# Patient Record
Sex: Female | Born: 1980 | Race: White | Hispanic: No | Marital: Single | Smoking: Never smoker
Health system: Southern US, Community
[De-identification: ages and names within clinical notes are randomized; demographics above are authoritative.]

## PROBLEM LIST (undated history)

## (undated) DIAGNOSIS — C44619 Basal cell carcinoma of skin of left upper limb, including shoulder: Secondary | ICD-10-CM

## (undated) DIAGNOSIS — R112 Nausea with vomiting, unspecified: Secondary | ICD-10-CM

## (undated) DIAGNOSIS — T8859XA Other complications of anesthesia, initial encounter: Secondary | ICD-10-CM

## (undated) DIAGNOSIS — E785 Hyperlipidemia, unspecified: Secondary | ICD-10-CM

## (undated) DIAGNOSIS — T4145XA Adverse effect of unspecified anesthetic, initial encounter: Secondary | ICD-10-CM

## (undated) DIAGNOSIS — Z9889 Other specified postprocedural states: Secondary | ICD-10-CM

## (undated) DIAGNOSIS — D649 Anemia, unspecified: Secondary | ICD-10-CM

## (undated) HISTORY — DX: Hyperlipidemia, unspecified: E78.5

## (undated) HISTORY — PX: WISDOM TOOTH EXTRACTION: SHX21

## (undated) HISTORY — DX: Basal cell carcinoma of skin of left upper limb, including shoulder: C44.619

---
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
## 2000-09-18 ENCOUNTER — Other Ambulatory Visit: Admission: RE | Admit: 2000-09-18 | Discharge: 2000-09-18 | Payer: Self-pay | Admitting: *Deleted

## 2001-10-05 ENCOUNTER — Other Ambulatory Visit: Admission: RE | Admit: 2001-10-05 | Discharge: 2001-10-05 | Payer: Self-pay | Admitting: Obstetrics and Gynecology

## 2002-10-12 ENCOUNTER — Other Ambulatory Visit: Admission: RE | Admit: 2002-10-12 | Discharge: 2002-10-12 | Payer: Self-pay | Admitting: Obstetrics and Gynecology

## 2003-11-01 ENCOUNTER — Other Ambulatory Visit: Admission: RE | Admit: 2003-11-01 | Discharge: 2003-11-01 | Payer: Self-pay | Admitting: Obstetrics and Gynecology

## 2004-01-23 ENCOUNTER — Encounter: Admission: RE | Admit: 2004-01-23 | Discharge: 2004-01-23 | Payer: Self-pay | Admitting: Family Medicine

## 2005-01-21 ENCOUNTER — Other Ambulatory Visit: Admission: RE | Admit: 2005-01-21 | Discharge: 2005-01-21 | Payer: Self-pay | Admitting: Obstetrics and Gynecology

## 2005-03-17 HISTORY — PX: BASAL CELL CARCINOMA EXCISION: SHX1214

## 2005-05-15 DIAGNOSIS — C44619 Basal cell carcinoma of skin of left upper limb, including shoulder: Secondary | ICD-10-CM

## 2005-05-15 HISTORY — DX: Basal cell carcinoma of skin of left upper limb, including shoulder: C44.619

## 2006-04-10 ENCOUNTER — Other Ambulatory Visit: Admission: RE | Admit: 2006-04-10 | Discharge: 2006-04-10 | Payer: Self-pay | Admitting: Obstetrics & Gynecology

## 2006-06-18 IMAGING — CR DG CHEST 2V
2 series · 2 of 2 positions shown · non-contrast
Comparison: none

CLINICAL DATA: Cough. 
 CHEST X-RAY: 
 The heart size and mediastinal contours are unremarkable.  The lungs are clear.  The visualized skeleton is unremarkable.

[view not recorded (1 of 2)]
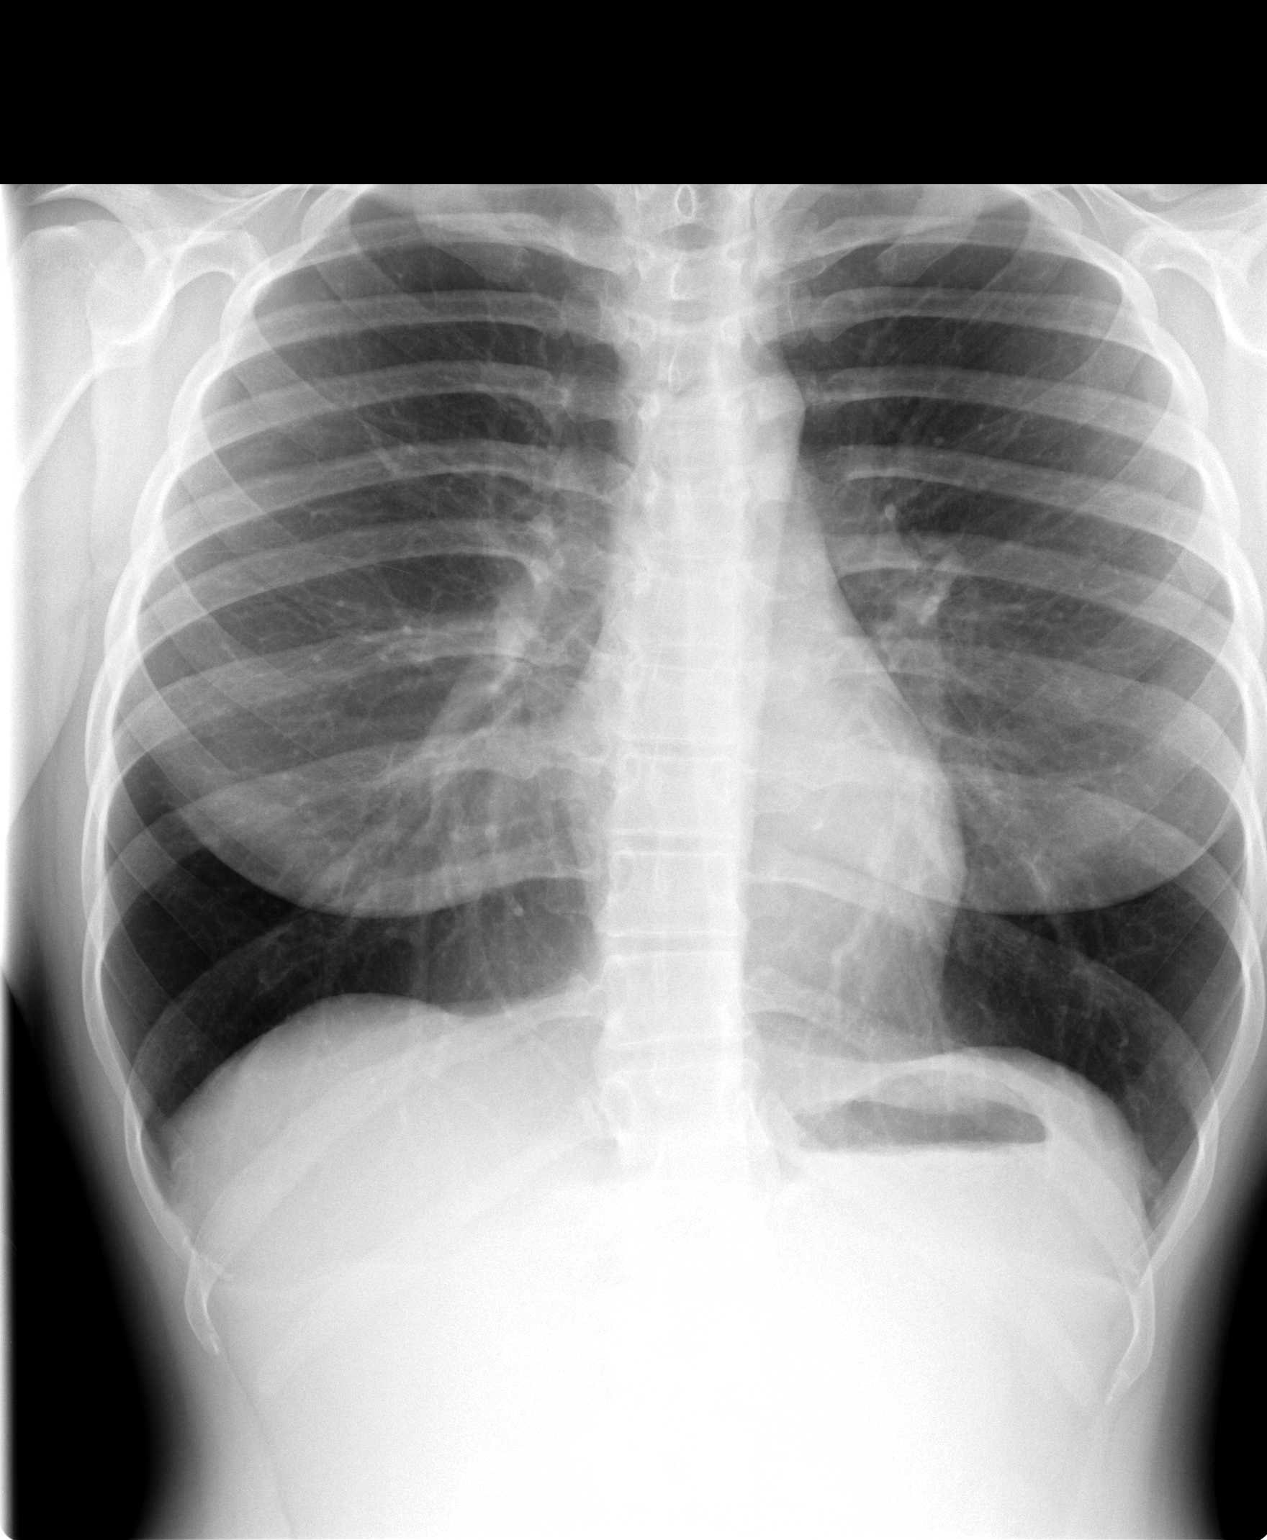

[view not recorded (2 of 2)]
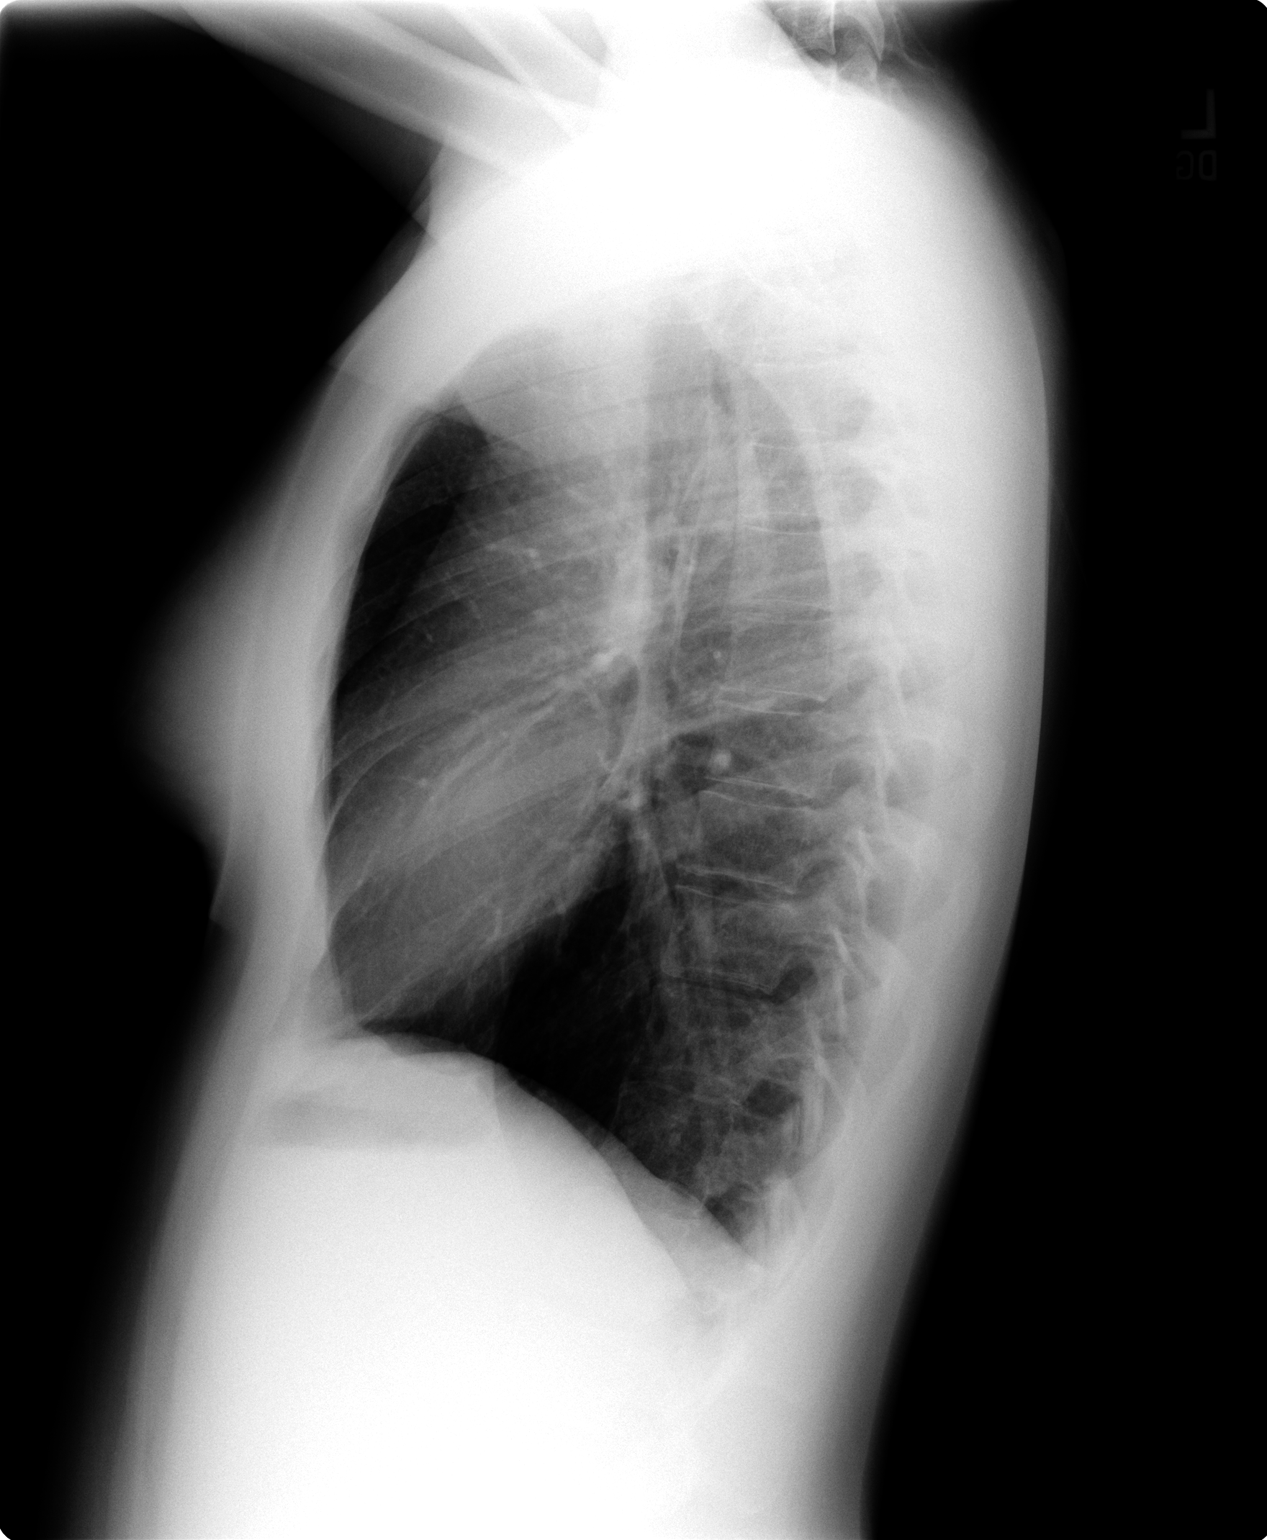

[2 of 2 positions shown; findings below may reference images not displayed]

IMPRESSION: No active lung disease.

## 2007-04-12 ENCOUNTER — Other Ambulatory Visit: Admission: RE | Admit: 2007-04-12 | Discharge: 2007-04-12 | Payer: Self-pay | Admitting: Obstetrics and Gynecology

## 2008-04-17 ENCOUNTER — Other Ambulatory Visit: Admission: RE | Admit: 2008-04-17 | Discharge: 2008-04-17 | Payer: Self-pay | Admitting: Obstetrics and Gynecology

## 2012-05-11 LAB — HM PAP SMEAR

## 2013-03-07 ENCOUNTER — Encounter: Payer: Self-pay | Admitting: Nurse Practitioner

## 2013-04-13 ENCOUNTER — Other Ambulatory Visit: Payer: Self-pay | Admitting: Nurse Practitioner

## 2013-04-13 NOTE — Telephone Encounter (Signed)
Last Refilled: 05/11/12 #90/3 refills AEX 05/17/13 with PG #84/0 refills sent to pharmacy to last her until AEX

## 2013-05-13 ENCOUNTER — Ambulatory Visit: Payer: Self-pay | Admitting: Nurse Practitioner

## 2013-05-17 ENCOUNTER — Encounter: Payer: Self-pay | Admitting: Nurse Practitioner

## 2013-05-17 ENCOUNTER — Ambulatory Visit (INDEPENDENT_AMBULATORY_CARE_PROVIDER_SITE_OTHER): Payer: BC Managed Care – PPO | Admitting: Nurse Practitioner

## 2013-05-17 VITALS — BP 104/66 | HR 80 | Ht 62.5 in | Wt 153.0 lb

## 2013-05-17 DIAGNOSIS — Z0000 Encounter for general adult medical examination without abnormal findings: Secondary | ICD-10-CM

## 2013-05-17 DIAGNOSIS — Z01419 Encounter for gynecological examination (general) (routine) without abnormal findings: Secondary | ICD-10-CM

## 2013-05-17 LAB — HEMOGLOBIN, FINGERSTICK: Hemoglobin, fingerstick: 12.5 g/dL (ref 12.0–16.0)

## 2013-05-17 MED ORDER — NORETHINDRONE-ETH ESTRADIOL 0.5-35 MG-MCG PO TABS: 1.0000 | ORAL_TABLET | Freq: Every day | ORAL | Status: DC

## 2013-05-17 NOTE — Patient Instructions (Signed)

## 2013-05-17 NOTE — Progress Notes (Signed)
Patient ID: Zollie Scale, female   DOB: August 28, 1980, 33 y.o.   MRN: [id number] 33 y.o. G0P0 Single Caucasian Fe here for annual exam.  Menses  At 4-5 days and heavy.  But still BTB a week prior to menses with spotting and occasional at mid cycle. Compliant to OCP.  In the past did well on Necon 1/35.  Not SA for some time.  Patient's last menstrual period was 05/12/2013.          Sexually active: no  The current method of family planning is OCP (estrogen/progesterone) and abstinence.    Exercising: yes  Gym/ health club routine includes cardio and light to moderate weights 3-5 times per week. Smoker:  no  Health Maintenance: Pap:  05/11/12, WNL TDaP:  04/19/09 Labs:  HB: 12.5   reports that she has never smoked. She has never used smokeless tobacco. She reports that she drinks about 1.5 ounces of alcohol per week. She reports that she does not use illicit drugs.  Past Medical History  Diagnosis Date  . Basal cell carcinoma of left shoulder 05/2005    Past Surgical History  Procedure Laterality Date  . Basal cell carcinoma excision  2007    left shoulder  . Wisdom tooth extraction  age 16    Current Outpatient Prescriptions  Medication Sig Dispense Refill  . norethindrone-ethinyl estradiol (NECON,BREVICON,MODICON) 0.5-35 MG-MCG tablet Take 1 tablet by mouth daily.  3 Package  3   No current facility-administered medications for this visit.    Family History  Problem Relation Age of Onset  . Multiple births Other     had twins  . Hypertension Maternal Grandmother   . Kidney Stones Maternal Grandfather   . Anemia Mother   . Thyroid disease Paternal Grandmother     ROS:  Pertinent items are noted in HPI.  Otherwise, a comprehensive ROS was negative.  Exam:   BP 104/66  Pulse 80  Ht 5' 2.5" (1.588 m)  Wt 153 lb (69.4 kg)  BMI 27.52 kg/m2  LMP 05/12/2013 Height: 5' 2.5" (158.8 cm)  Ht Readings from Last 3 Encounters:  05/17/13 5' 2.5" (1.588 m)    General appearance:  alert, cooperative and appears stated age Head: Normocephalic, without obvious abnormality, atraumatic Neck: no adenopathy, supple, symmetrical, trachea midline and thyroid normal to inspection and palpation Lungs: clear to auscultation bilaterally Breasts: normal appearance, no masses or tenderness Heart: regular rate and rhythm Abdomen: soft, non-tender; no masses,  no organomegaly Extremities: extremities normal, atraumatic, no cyanosis or edema Skin: Skin color, texture, turgor normal. No rashes or lesions Lymph nodes: Cervical, supraclavicular, and axillary nodes normal. No abnormal inguinal nodes palpated Neurologic: Grossly normal   Pelvic: External genitalia:  no lesions              Urethra:  normal appearing urethra with no masses, tenderness or lesions              Bartholin's and Skene's: normal                 Vagina: normal appearing vagina with normal color and discharge, no lesions              Cervix: anteverted              Pap taken: no Bimanual Exam:  Uterus:  normal size, contour, position, consistency, mobility, non-tender              Adnexa: no mass, fullness, tenderness  Rectovaginal: Confirms               Anus:  normal sphincter tone, no lesions  A:  Well Woman with normal exam  OCP for cycle regulation  P:   Pap smear as per guidelines   Will repeat pap and HPV next year  Change OCP to Necon 1/35 for a year - let us know if still heavy and BTB issues.  Counseled on breast self exam, adequate intake of calcium and vitamin D, diet and exercise return annually or prn  An After Visit Summary was printed and given to the patient.

## 2013-05-18 NOTE — Progress Notes (Signed)
Encounter reviewed by Dr. Brook Silva.  

## 2014-05-19 ENCOUNTER — Ambulatory Visit (INDEPENDENT_AMBULATORY_CARE_PROVIDER_SITE_OTHER): Payer: BC Managed Care – PPO | Admitting: Nurse Practitioner

## 2014-05-19 ENCOUNTER — Encounter: Payer: Self-pay | Admitting: Nurse Practitioner

## 2014-05-19 VITALS — BP 114/72 | HR 72 | Ht 62.5 in | Wt 169.0 lb

## 2014-05-19 DIAGNOSIS — Z113 Encounter for screening for infections with a predominantly sexual mode of transmission: Secondary | ICD-10-CM | POA: Diagnosis not present

## 2014-05-19 DIAGNOSIS — R319 Hematuria, unspecified: Secondary | ICD-10-CM

## 2014-05-19 DIAGNOSIS — Z0000 Encounter for general adult medical examination without abnormal findings: Secondary | ICD-10-CM

## 2014-05-19 DIAGNOSIS — Z01419 Encounter for gynecological examination (general) (routine) without abnormal findings: Secondary | ICD-10-CM

## 2014-05-19 LAB — POCT URINALYSIS DIPSTICK
Bilirubin, UA: NEGATIVE
Blood, UA: NEGATIVE
Glucose, UA: NEGATIVE
Ketones, UA: NEGATIVE
Leukocytes, UA: NEGATIVE
NITRITE UA: NEGATIVE
Protein, UA: NEGATIVE
Urobilinogen, UA: NEGATIVE
pH, UA: 6

## 2014-05-19 LAB — WET PREP BY MOLECULAR PROBE
CANDIDA SPECIES: NEGATIVE
Gardnerella vaginalis: NEGATIVE
Trichomonas vaginosis: NEGATIVE

## 2014-05-19 LAB — HEMOGLOBIN, FINGERSTICK: Hemoglobin, fingerstick: 12.4 g/dL (ref 12.0–16.0)

## 2014-05-19 MED ORDER — NECON 1/35 (28) 1-35 MG-MCG PO TABS: 1.0000 | ORAL_TABLET | Freq: Every day | ORAL | Status: DC

## 2014-05-19 NOTE — Patient Instructions (Signed)

## 2014-05-19 NOTE — Progress Notes (Signed)
Patient ID: Mariah Nichols, female   DOB: [birth year], 34 y.o.   MRN: [id number] 34 y.o. G0P0 Single  Caucasian Fe here for annual exam.  Menses normally 5-7 days. At mid cycle with spotting for 3-5 days since on the generic Necon - dose is changed by pharmacy to 0.5/35 instead of 1/35.  Will refill her original OCP for brand only.  If still mid cycle BTB to call back. The long term on/off relationship is now ended.  She has found out he has been unfaithful.  Desires to get STD's.  Patient's last menstrual period was 05/05/2014.          Sexually active: Yes.    The current method of family planning is OCP (estrogen/progesterone).    Exercising: No.  Pt has not been exercising routinely recently Smoker:  no  Health Maintenance: Pap:  05/10/12, negative  TDaP:  04/19/09 Labs: HB:  12.4 Urine:  Small RBC   reports that she has never smoked. She has never used smokeless tobacco. She reports that she drinks about 1.5 oz of alcohol per week. She reports that she does not use illicit drugs.  Past Medical History  Diagnosis Date  . Basal cell carcinoma of left shoulder 05/2005    Past Surgical History  Procedure Laterality Date  . Basal cell carcinoma excision  2007    left shoulder  . Wisdom tooth extraction  age 86    Current Outpatient Prescriptions  Medication Sig Dispense Refill  . NECON 1/35, 28, tablet Take 1 tablet by mouth daily. 3 Package 3   No current facility-administered medications for this visit.    Family History  Problem Relation Age of Onset  . Multiple births Other     had twins  . Hypertension Maternal Grandmother   . Kidney Stones Maternal Grandfather   . Anemia Mother   . Uterine cancer Mother 69    uterus  . Thyroid disease Paternal Grandmother     ROS:  Pertinent items are noted in HPI.  Otherwise, a comprehensive ROS was negative.  Exam:   BP 114/72 mmHg  Pulse 72  Ht 5' 2.5" (1.588 m)  Wt 169 lb (76.658 kg)  BMI 30.40 kg/m2  LMP 05/05/2014 Height:  5' 2.5" (158.8 cm) Ht Readings from Last 3 Encounters:  05/19/14 5' 2.5" (1.588 m)  05/17/13 5' 2.5" (1.588 m)    General appearance: alert, cooperative and appears stated age Head: Normocephalic, without obvious abnormality, atraumatic Neck: no adenopathy, supple, symmetrical, trachea midline and thyroid normal to inspection and palpation Lungs: clear to auscultation bilaterally Breasts: normal appearance, no masses or tenderness Heart: regular rate and rhythm Abdomen: soft, non-tender; no masses,  no organomegaly Extremities: extremities normal, atraumatic, no cyanosis or edema Skin: Skin color, texture, turgor normal. No rashes or lesions Lymph nodes: Cervical, supraclavicular, and axillary nodes normal. No abnormal inguinal nodes palpated Neurologic: Grossly normal   Pelvic: External genitalia:  no lesions              Urethra:  normal appearing urethra with no masses, tenderness or lesions              Bartholin's and Skene's: normal                 Vagina: normal appearing vagina with normal color and discharge, no lesions              Cervix: anteverted  Pap taken: Yes.   Bimanual Exam:  Uterus:  normal size, contour, position, consistency, mobility, non-tender              Adnexa: no mass, fullness, tenderness               Rectovaginal: Confirms               Anus:  normal sphincter tone, no lesions  Chaperone present: yes  A:  Well Woman with normal exam  Contraception  R/O STD's  Hematuria asymptomatic  P:   Reviewed health and wellness pertinent to exam  Pap smear taken today  Change Rx for Necon to be 1/35 mcg. for a year  Follow with STD's, urine culture  Counseled on breast self exam, use and side effects of OCP's, adequate intake of calcium and vitamin D, diet and exercise return annually or prn  An After Visit Summary was printed and given to the patient.

## 2014-05-20 LAB — URINALYSIS, MICROSCOPIC ONLY
Bacteria, UA: NONE SEEN
Casts: NONE SEEN
Crystals: NONE SEEN
Squamous Epithelial / HPF: NONE SEEN

## 2014-05-20 LAB — STD PANEL
HIV: NONREACTIVE
Hepatitis B Surface Ag: NEGATIVE

## 2014-05-21 LAB — URINE CULTURE
Colony Count: NO GROWTH
Organism ID, Bacteria: NO GROWTH

## 2014-05-22 NOTE — Progress Notes (Signed)
Encounter reviewed by Dr. Brook Silva.  

## 2014-05-23 ENCOUNTER — Telehealth: Payer: Self-pay | Admitting: *Deleted

## 2014-05-23 NOTE — Telephone Encounter (Signed)
I have attempted to contact this patient by phone with the following results: left message to return call to [place] at [phone] on answering machine (mobile per [facility]).  [phone] (Mobile)  Pt name verified in voicemail.  Advised call was regarding lab results and to return call at her convenience.

## 2014-05-23 NOTE — Telephone Encounter (Signed)
-----   Message from Milford Cage, [place] sent at 05/21/2014  6:36 PM EST ----- Let pt. know that urine culture, Affirm and STD panel with HIV, Hep B. STS. is normal.  The GC and Chl is pending

## 2014-05-24 LAB — IPS N GONORRHOEA AND CHLAMYDIA BY PCR

## 2014-05-24 LAB — IPS PAP TEST WITH HPV

## 2014-05-24 NOTE — Telephone Encounter (Signed)
Pt notified in result note.  GC/Chlamydia still pending.  Leaving note open.

## 2014-07-03 ENCOUNTER — Other Ambulatory Visit: Payer: Self-pay | Admitting: Nurse Practitioner

## 2014-07-03 NOTE — Telephone Encounter (Signed)
Medication refill request: Nortrel. Patient is on Necon  Last AEX:  05/19/14 PG Next AEX: 05/24/15 PG Last MMG (if hormonal medication request): none Refill authorized: 05/19/14 #3packs/ 3 refills to [facility]

## 2014-07-04 ENCOUNTER — Telehealth: Payer: Self-pay | Admitting: Nurse Practitioner

## 2014-07-04 NOTE — Telephone Encounter (Signed)
LMTCB about canceled appointment °

## 2014-07-05 NOTE — Telephone Encounter (Signed)
Pt notified of all results in result note on 05/26/14.  Closing encounter.

## 2014-10-16 ENCOUNTER — Telehealth: Payer: Self-pay | Admitting: Nurse Practitioner

## 2014-10-16 NOTE — Telephone Encounter (Signed)
Spoke with patient. Patient states that she has been taking Necon for a couple of years but has had to increase the dosage due to mid cycle bleeding. States dosage was increased again at aex on 05/19/2014 with Milford Cage, FNP and everything has been going well until this month. Patient is now having intermittent break through bleeding like her cycle that will last a day. Next menses due to start next week. Had bleeding once today and once last week. States bleeding is accompanied with cramping. Patient is not sexually active. States Milford Cage, FNP mentioned her having a PUS performed. Requesting recommendations on what to do at this point. Advised I will speak with Milford Cage, FNP and return call with further recommendations. Patient is agreeable.

## 2014-10-16 NOTE — Telephone Encounter (Signed)
Patient is having irregular menses.

## 2014-10-17 NOTE — Telephone Encounter (Signed)
I discussed her care with Dr. Quincy Simmonds last pm - she recommends an OV first then decide if PUS is needed.  Can you make a follow up apt. With Dr. Quincy Simmonds or Dr. Talbert Nan.

## 2014-10-17 NOTE — Telephone Encounter (Signed)
Spoke with patient. Advised of message as seen below from Mariah Nichols, [place]. Patient is agreeable. Requesting appointment for Friday 10/20/2014. Appointment scheduled for 10/20/2014 at 1pm with Dr.Silva. Patient is agreeable to date and time.  Cc: Dr.Silva  Routing to provider for final review. Patient agreeable to disposition. Will close encounter.   Patient aware provider will review message and nurse will return call if any additional advice or change of disposition.

## 2014-10-20 ENCOUNTER — Encounter: Payer: Self-pay | Admitting: Obstetrics and Gynecology

## 2014-10-20 ENCOUNTER — Ambulatory Visit (INDEPENDENT_AMBULATORY_CARE_PROVIDER_SITE_OTHER): Payer: BC Managed Care – PPO | Admitting: Obstetrics and Gynecology

## 2014-10-20 VITALS — BP 104/66 | HR 80 | Temp 98.7°F | Ht 62.5 in | Wt 171.0 lb

## 2014-10-20 DIAGNOSIS — Z113 Encounter for screening for infections with a predominantly sexual mode of transmission: Secondary | ICD-10-CM

## 2014-10-20 DIAGNOSIS — N926 Irregular menstruation, unspecified: Secondary | ICD-10-CM

## 2014-10-20 DIAGNOSIS — M25559 Pain in unspecified hip: Secondary | ICD-10-CM | POA: Diagnosis not present

## 2014-10-20 LAB — HEMOGLOBIN, FINGERSTICK: Hemoglobin, fingerstick: 12.2 g/dL (ref 12.0–16.0)

## 2014-10-20 LAB — POCT URINE PREGNANCY: Preg Test, Ur: NEGATIVE

## 2014-10-20 NOTE — Progress Notes (Signed)
GYNECOLOGY  VISIT   HPI: 34 y.o.   Single  Caucasian  female   G0P0 with Patient's last menstrual period was 09/24/2014.   here for Irregular Vaginal Bleeding    On Necon now.   Long history of dysmenorrhea and heavy menses which is why she started OCPs about 10 years ago.  Developed amenorrhea after a while and stopped OCPs.  Symptoms eventually returned and then started low dose OCPs.  Developed midcycle spotting and so dosages were increased on her pills.  Since starting Necon, still having spotting and some pain.  This month has had two mid cycle episodes of heavy bleeding and clotting.  Pain has also returned.  Taking ibuprofen which helps.   No prior ultrasound or surgery.   Relationship ended this year due to infidelity. STD testing, vaginitis screen, and pap were negative at March 2016 visit.  Last intercourse was 4 months ago.  No new partners but did reunite with her former partner.  Teaches high school history.  UPT - negative  GYNECOLOGIC HISTORY: Patient's last menstrual period was 09/24/2014. Contraception: Necon  Menopausal hormone therapy: None Last mammogram: None Last pap smear: 05/19/14 Neg. HR HPV:neg        OB History    Gravida Para Term Preterm AB TAB SAB Ectopic Multiple Living   0 0                 There are no active problems to display for this patient.   Past Medical History  Diagnosis Date  . Basal cell carcinoma of left shoulder 05/2005    Past Surgical History  Procedure Laterality Date  . Basal cell carcinoma excision  2007    left shoulder  . Wisdom tooth extraction  age 75    Current Outpatient Prescriptions  Medication Sig Dispense Refill  . NECON 1/35, 28, tablet Take 1 tablet by mouth daily. 3 Package 3   No current facility-administered medications for this visit.     ALLERGIES: Review of patient's allergies indicates no known allergies.  Family History  Problem Relation Age of Onset  . Multiple births Other    had twins  . Hypertension Maternal Grandmother   . Kidney Stones Maternal Grandfather   . Anemia Mother   . Uterine cancer Mother 22    uterus  . Thyroid disease Paternal Grandmother     History   Social History  . Marital Status: Single    Spouse Name: N/A  . Number of Children: 0  . Years of Education: N/A   Occupational History  . Not on file.   Social History Main Topics  . Smoking status: Never Smoker   . Smokeless tobacco: Never Used  . Alcohol Use: 1.5 oz/week    3 Standard drinks or equivalent per week  . Drug Use: No  . Sexual Activity:    Partners: Male    Birth Control/ Protection: Pill   Other Topics Concern  . Not on file   Social History Narrative    ROS:  Pertinent items are noted in HPI.  PHYSICAL EXAMINATION:    BP 104/66 mmHg  Pulse 80  Temp(Src) 98.7 F (37.1 C) (Oral)  Ht 5' 2.5" (1.588 m)  Wt 171 lb (77.565 kg)  BMI 30.76 kg/m2  LMP 09/24/2014    General appearance: alert, cooperative and appears stated age   Pelvic: External genitalia:  no lesions              Urethra:  normal  appearing urethra with no masses, tenderness or lesions              Bartholins and Skenes: normal                 Vagina: normal appearing vagina with normal color and discharge, no lesions.  Light menstrual blood.              Cervix: no lesions              Bimanual Exam:  Uterus:  normal size, contour, position, consistency, mobility, non-tender              Adnexa: normal adnexa and no mass, fullness, tenderness         Chaperone was present for exam.  ASSESSMENT  Irregular bleeding on combined OCPs. Pelvic pain/dysmenorrhea. FH of uterine cancer.   PLAN  Counseled regarding abnormal uterine bleeding etiologies.  Finish current pack of OCPs (last active pill is today) and have cycle. Will do STD testing and Hgb today - 12.2. Return for pelvic ultrasound.    An After Visit Summary was printed and given to the patient.  _15____ minutes face to  face time of which over 50% was spent in counseling.

## 2014-10-21 LAB — STD PANEL
HEP B S AG: NEGATIVE
HIV 1&2 Ab, 4th Generation: NONREACTIVE

## 2014-10-21 LAB — HEPATITIS C ANTIBODY: HCV AB: NEGATIVE

## 2014-10-24 LAB — IPS N GONORRHOEA AND CHLAMYDIA BY PCR

## 2014-11-06 ENCOUNTER — Telehealth: Payer: Self-pay | Admitting: Obstetrics and Gynecology

## 2014-11-06 NOTE — Telephone Encounter (Signed)
Finally able to get through to patients insurance on the phone. Spoke with patient regarding scheduling/benefits for PUS. Patient understands and is agreeable to benefit. Patient aware there is an opening at 8am on 11/09/14 and is going to check with work and call back if she is able to make the appointment. Patient is aware appointment may be taken by the time she is able to call back. Patient will call 11/07/14 to verify decision.  Routing to sally for review.

## 2014-11-08 NOTE — Telephone Encounter (Signed)
Appointment is scheduled for 11-09-14 at 130 for ultrasound and consult with Dr Quincy Simmonds on 11-10-14.  Routing to provider for final review.  Will close encounter.

## 2014-11-09 ENCOUNTER — Ambulatory Visit (INDEPENDENT_AMBULATORY_CARE_PROVIDER_SITE_OTHER): Payer: BC Managed Care – PPO

## 2014-11-09 DIAGNOSIS — M25559 Pain in unspecified hip: Secondary | ICD-10-CM | POA: Diagnosis not present

## 2014-11-09 DIAGNOSIS — N926 Irregular menstruation, unspecified: Secondary | ICD-10-CM

## 2014-11-09 NOTE — Progress Notes (Signed)
Subjective  34 y.o. G58P0  Caucasian female here for pelvic ultrasound for abnormal menstrual bleeding and pelvic pain.  Long history of dysmenorrhea and heavy menses which is why she started OCPs about 10 years ago.  Developed amenorrhea after a while and stopped OCPs.  Symptoms eventually returned and then started low dose OCPs.  Developed midcycle spotting and so dosages were increased on her pills.  Since starting Necon, still having spotting and some pain.  This last month has had two mid cycle episodes of heavy bleeding and clotting.  Pain has also returned, which is mostly right sided. Taking ibuprofen which helps.   No prior ultrasound or surgery.   STD testing negative 10/20/14.   FH of uterine cancer in mother.   Objective  Pelvic ultrasound images and report reviewed with patient.  Uterus - 3 fibroids: 0.92 - 3.83 cm (right, posterior fundal). EMS - 3.44 mm.  7 x 11 mm endometrial fundal (subserosal) probable fibroid. Ovaries - normal. Free fluid - yes, mild free fluid.       Assessment  Uterine fibroids - one small fibroid is submucosal.  Menorrhagia.  Pelvic pain - right sided. On combined OCPs.  FH of uterine cancer.   Plan  Comprehensive discussion of uterine fibroids, menorrhagia, and pelvic pain - etiologies of which can include endometriosis.  ACOG handouts on fibroids.  Discussion of option for EMB for diagnosis of endometrial mass versus hysteroscopy with excision of uterine fibroid, dilation and curettage.  ACOG handouts on hysteroscopy/D & C.   Discussed risks and benefits.  I also discussed options for treatment of fibroids - hormonal contraception, Lysteda, uterine artery embolization, myomectomy - laparoscopic versus open, hysterectomy.  Patient will determine if she wants to do surgical versus medical therapy for her care.  Patient will call back in one week after she has an opportunity to do some further self study and consideration of  options.   ___25____ minutes face to face time of which over 50% was spent in counseling.   After visit summary to patient.

## 2014-11-10 ENCOUNTER — Ambulatory Visit (INDEPENDENT_AMBULATORY_CARE_PROVIDER_SITE_OTHER): Payer: BC Managed Care – PPO | Admitting: Obstetrics and Gynecology

## 2014-11-10 ENCOUNTER — Encounter: Payer: Self-pay | Admitting: Obstetrics and Gynecology

## 2014-11-10 VITALS — BP 118/80 | HR 68 | Resp 16 | Ht 62.5 in | Wt 172.0 lb

## 2014-11-10 DIAGNOSIS — D259 Leiomyoma of uterus, unspecified: Secondary | ICD-10-CM

## 2014-11-10 DIAGNOSIS — M25551 Pain in right hip: Secondary | ICD-10-CM | POA: Diagnosis not present

## 2014-11-27 ENCOUNTER — Telehealth: Payer: Self-pay | Admitting: Obstetrics and Gynecology

## 2014-11-29 NOTE — Telephone Encounter (Signed)
Left message to call Kaitlyn at [phone]. 

## 2014-11-29 NOTE — Telephone Encounter (Signed)
Please contact patient regarding her next steps in care.  Her evaluation to date is incomplete following her pelvic ultrasound.  We discussed endometrial biopsy versus hysteroscopy with resection of endometrial mass. She has heavy bleeding and right sided pelvic pain and fibroids.  FH of uterine cancer.

## 2014-12-05 NOTE — Telephone Encounter (Signed)
Spoke with patient. Patients that she is leaning towards having a hysteroscopy performed. Is unable to proceed at this time as she is going to be switching insurance which will not become effective until next year. "Right now my insurance barely covers anything so I am going to switch to a plan that covers more before I have it done." Advised this evaluation is very important. Patient states she will call to schedule once she has her new insurance. Advised I will let Dr.Silva know. Patient is agreeable.

## 2014-12-05 NOTE — Telephone Encounter (Signed)
Please place secondary call.

## 2014-12-06 NOTE — Telephone Encounter (Signed)
Please schedule appointment with me for recheck in 2 months.  Keep bleeding calendar.

## 2014-12-07 NOTE — Telephone Encounter (Signed)
Left message to call Kaitlyn at [phone]. 

## 2014-12-12 NOTE — Telephone Encounter (Signed)
Left message to call Kaitlyn at [phone]. 

## 2014-12-14 NOTE — Telephone Encounter (Signed)
Left message to call Kaitlyn at [phone]. 

## 2014-12-18 NOTE — Telephone Encounter (Signed)
Spoke with patient. Advised of message as seen below from [place]. Patient states she is unable to schedule until January of 2017 due to insurance coverage. Appointment scheduled for 03/21/2015 at 9 am with Dr.Silva. Patient is agreeable to date and time.  Routing to provider for final review. Patient agreeable to disposition. Will close encounter.

## 2015-03-21 ENCOUNTER — Ambulatory Visit: Payer: BC Managed Care – PPO | Admitting: Obstetrics and Gynecology

## 2015-03-28 ENCOUNTER — Telehealth: Payer: Self-pay | Admitting: Obstetrics and Gynecology

## 2015-03-28 NOTE — Telephone Encounter (Signed)
Spoke with patient. Patient was scheduled for follow up on 04/11/2015 with Dr.Silva from pelvic pain and bleeding. Was last seen on 11/10/2014 for PUS due to symptoms. Patient has decided she would like to move forward with D&C hysteroscopy. Patient states she is unable to have this performed until April 2017 when she is on Spring break from school. Spring break will be 4/10-4/14. "I know she told me my appointment had to be during a certain time before the surgery. So I don't know if I need an appointment before then." Patient also has an aex scheduled for 05/25/2015 at 8:30 am with Kem Boroughs, FNP. Denies any current pelvic pain or bleeding. States this occurred once since she was seen in 10/2014. Advised I will speka with Dr.Silva and return call with appointment/follow up recommendations. Patient is agreeable.

## 2015-03-28 NOTE — Telephone Encounter (Signed)
Spoke with patient. Advised of message from [place] as seen below. Telephone call for 11 minutes discussing appointments. Patient is concerned about having to pay for three appointments prior to surgery. Asking if appointments can be combined so that she only needs two. "I just can't afford to come in for a follow up then an annual and then a pre op appointment." Offered to cancel her aex with Kem Boroughs, FNP and reschedule with Dr.Silva in combination with her follow up. Advised she will then need a pre-op appointment prior to her surgery. Patient is asking if she comes in today for a follow up if she can have her aex and pre op at the same appointment. Advised these two appointments can not be combined due to the amount of time needed for each appointment. "So what you're telling me is I have to come in three times no matter what?" Advised I am trying to work with her schedule in a manor that will allow Dr.Silva to properly evaluate her and give her enough time for her appointments. "I just do not think I need to have three appointments." Patient requests I speak with Dr.Silva regarding appointments and return call.

## 2015-03-28 NOTE — Telephone Encounter (Signed)
Please contact patient to put together a plan for a problem visit and an annual exam.  Patient is considering surgical care.  I have not seen her in several months, so she will need reassessment prior to doing any surgical scheduling.  Appointments to be coordinated by Gay Filler, our surgical scheduler.  [place]

## 2015-03-28 NOTE — Telephone Encounter (Signed)
Patient canceled her upcoming follow up appointment 04/11/15 due to change in work schedule. 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 Patient is asking if she could be worked in today. Patient said she needs to have this recheck before she can schedule surgery. Patient was hoping to have surgery over spring break.

## 2015-03-28 NOTE — Telephone Encounter (Signed)
Patient will need to have an appointment to have an exam and rediscuss a plan.  Please work her in where there is an appropriate time slot.  I will need more than 15 minutes.

## 2015-04-02 NOTE — Telephone Encounter (Signed)
Call to patient, left message to call back, ask for Gay Filler to assist with scheduling.

## 2015-04-03 NOTE — Telephone Encounter (Signed)
Chart to precert. Encounter closed.

## 2015-04-03 NOTE — Telephone Encounter (Signed)
Return call to patient. Patient very anxious to proceed with scheduling surgery based on dates of school spring date.  Last seen 10-2014, recommend office visit for updated exam, recheck fibroids, reassess plan for surgery. Patient declines.  Patient states she was given options for surgery at appointment [phone] and told to call when ready to schedule.      Patient currently scheduled for annual with Edman Circle, FNP in March. Advised can change annual exam to Dr Quincy Simmonds and at that time determine if current plan for surgery is still accurate. Advised can plan tentative date for surgery as requested on 06-26-15 but is subject to change based on exam with Dr Quincy Simmonds.  Surgery scheduling policies reviewed with patient. Advised will begin precert based on procedure planned from 10-2014.  Annual exam scheduled with Dr Quincy Simmonds for 05-30-15 based on patient schedule request.   Hysteroscopy/fibroid resection/Myosure/D&C to be precerted.  Routing to provider for final review.

## 2015-04-03 NOTE — Telephone Encounter (Signed)
Patient returning call.

## 2015-04-03 NOTE — Telephone Encounter (Signed)
Thank you for the update.  I will need to see the patient to confirm the final surgical plan.  OK to precert the hysteroscopy with resection of subserosal fibroid with Myosure, dilation and curettage for now.   Cc- Lerry Liner

## 2015-04-11 ENCOUNTER — Ambulatory Visit: Payer: BC Managed Care – PPO | Admitting: Obstetrics and Gynecology

## 2015-04-24 ENCOUNTER — Other Ambulatory Visit: Payer: Self-pay | Admitting: *Deleted

## 2015-04-24 ENCOUNTER — Telehealth: Payer: Self-pay | Admitting: Obstetrics and Gynecology

## 2015-04-24 MED ORDER — NECON 1/35 (28) 1-35 MG-MCG PO TABS: 1.0000 | ORAL_TABLET | Freq: Every day | ORAL | Status: DC

## 2015-04-24 NOTE — Telephone Encounter (Signed)
Medication refill request: Necon  Last AEX:  05-19-14 Next AEX: 05-30-15 Last MMG (if hormonal medication request): N/A Refill authorized: please advise

## 2015-04-24 NOTE — Telephone Encounter (Signed)
Called patient to discuss benefits for recommended procedure. Left Voicemail requesting a call back.

## 2015-04-25 ENCOUNTER — Other Ambulatory Visit: Payer: Self-pay

## 2015-04-25 NOTE — Telephone Encounter (Signed)
Medication refill request: necon Last AEX:  05-19-14 Next AEX: 05-30-15 Last MMG (if hormonal medication request): n/a Refill authorized: last visit for bleeding was 10-20-14 [facility] 21mth supply to last until aex. Please advise.

## 2015-04-25 NOTE — Telephone Encounter (Signed)
A fax came back in that is from [employer] order.

## 2015-04-25 NOTE — Telephone Encounter (Signed)
Please confirm if this is [facility] or [facility] [address].  Thanks!

## 2015-04-26 ENCOUNTER — Other Ambulatory Visit: Payer: Self-pay | Admitting: Nurse Practitioner

## 2015-04-26 MED ORDER — NECON 1/35 (28) 1-35 MG-MCG PO TABS: 1.0000 | ORAL_TABLET | Freq: Every day | ORAL | Status: DC

## 2015-04-30 NOTE — Telephone Encounter (Signed)
Spoke with patient regarding benefits for recommended outpatient procedure. Patient request to speak with [place], as well and call back to schedule

## 2015-05-02 NOTE — Telephone Encounter (Signed)
Patient returned call

## 2015-05-08 ENCOUNTER — Telehealth: Payer: Self-pay | Admitting: Obstetrics and Gynecology

## 2015-05-08 NOTE — Telephone Encounter (Signed)
Called patient to review benefits for a recommended procedure. Left Voicemail requesting a call back. °

## 2015-05-14 NOTE — Telephone Encounter (Signed)
Patient returned call

## 2015-05-14 NOTE — Telephone Encounter (Signed)
Spoke with pt regarding benefit for surgery. Patient understood and agreeable. Patient ready to schedule. Patient provided surgery deposit over the phone. Patient aware this is professional benefit only. Patient aware will be contacted by hospital for separate benefits. Staff message to Sally for scheduling °

## 2015-05-24 ENCOUNTER — Ambulatory Visit: Payer: BC Managed Care – PPO | Admitting: Nurse Practitioner

## 2015-05-25 ENCOUNTER — Ambulatory Visit: Payer: BC Managed Care – PPO | Admitting: Nurse Practitioner

## 2015-05-30 ENCOUNTER — Encounter: Payer: Self-pay | Admitting: Obstetrics and Gynecology

## 2015-05-30 ENCOUNTER — Ambulatory Visit (INDEPENDENT_AMBULATORY_CARE_PROVIDER_SITE_OTHER): Payer: BC Managed Care – PPO | Admitting: Obstetrics and Gynecology

## 2015-05-30 VITALS — BP 102/70 | HR 90 | Resp 14 | Ht 62.5 in | Wt 172.2 lb

## 2015-05-30 DIAGNOSIS — Z01419 Encounter for gynecological examination (general) (routine) without abnormal findings: Secondary | ICD-10-CM | POA: Diagnosis not present

## 2015-05-30 DIAGNOSIS — N926 Irregular menstruation, unspecified: Secondary | ICD-10-CM | POA: Diagnosis not present

## 2015-05-30 DIAGNOSIS — Z113 Encounter for screening for infections with a predominantly sexual mode of transmission: Secondary | ICD-10-CM

## 2015-05-30 LAB — LIPID PANEL
Cholesterol: 237 mg/dL — ABNORMAL HIGH (ref 125–200)
HDL: 66 mg/dL (ref >=46)
LDL Cholesterol: 126 mg/dL (ref <130)
Total CHOL/HDL Ratio: 3.6 Ratio (ref <=5.0)
Triglycerides: 225 mg/dL — ABNORMAL HIGH (ref <150)
VLDL: 45 mg/dL — ABNORMAL HIGH (ref <30)

## 2015-05-30 LAB — COMPREHENSIVE METABOLIC PANEL
ALT: 7 U/L (ref 6–29)
AST: 11 U/L (ref 10–30)
Albumin: 4.1 g/dL (ref 3.6–5.1)
Alkaline Phosphatase: 46 U/L (ref 33–115)
BUN: 17 mg/dL (ref 7–25)
CO2: 26 mmol/L (ref 20–31)
Calcium: 9.8 mg/dL (ref 8.6–10.2)
Chloride: 99 mmol/L (ref 98–110)
Creat: 0.74 mg/dL (ref 0.50–1.10)
Glucose, Bld: 81 mg/dL (ref 65–99)
Potassium: 4.2 mmol/L (ref 3.5–5.3)
Sodium: 134 mmol/L — ABNORMAL LOW (ref 135–146)
TOTAL PROTEIN: 6.7 g/dL (ref 6.1–8.1)
Total Bilirubin: 0.2 mg/dL (ref 0.2–1.2)

## 2015-05-30 LAB — CBC
HCT: 37.5 % (ref 36.0–46.0)
Hemoglobin: 12.9 g/dL (ref 12.0–15.0)
MCH: 31 pg (ref 26.0–34.0)
MCHC: 34.4 g/dL (ref 30.0–36.0)
MCV: 90.1 fL (ref 78.0–100.0)
MPV: 9.3 fL (ref 8.6–12.4)
PLATELETS: 352 10*3/uL (ref 150–400)
RBC: 4.16 MIL/uL (ref 3.87–5.11)
RDW: 12.8 % (ref 11.5–15.5)
WBC: 11.6 10*3/uL — AB (ref 4.0–10.5)

## 2015-05-30 LAB — POCT URINALYSIS DIPSTICK
Bilirubin, UA: NEGATIVE
Glucose, UA: NEGATIVE
Ketones, UA: NEGATIVE
Leukocytes, UA: NEGATIVE
NITRITE UA: NEGATIVE
Protein, UA: NEGATIVE
Urobilinogen, UA: NEGATIVE
pH, UA: 5

## 2015-05-30 MED ORDER — NECON 1/35 (28) 1-35 MG-MCG PO TABS: 1.0000 | ORAL_TABLET | Freq: Every day | ORAL | Status: DC

## 2015-05-30 NOTE — Progress Notes (Signed)
Patient ID: Mariah Nichols, female   DOB: 1980-04-23, 35 y.o.   MRN: [id number] 35 y.o. G0P0 Single Caucasian female here for annual exam.    On Necon.  Having irregular cycles again.  Bled almost full month in January.  Menses in February normal.  Bleeding again today.   Has known intracavitary fibroid.  Also has a 5 cm posterior subserosal fibroid.  PCP:  Sadie Haber @ [facility]    Patient's last menstrual period was 05/12/2015 (exact date).          Sexually active: No. female The current method of family planning is OCP (estrogen/progesterone)--Necon.    Exercising: No.   Smoker:  no  Health Maintenance: Pap:  05-19-14 Neg:Neg HR HPV History of abnormal Pap:  no MMG:  n/a Colonoscopy:  n/a BMD:   n/a  Result  n/a TDaP:  04-19-09 Screening Labs:  Hb today: PCP, Urine today: moderate RBCs--see LMP--began bleeding in office   reports that she has never smoked. She has never used smokeless tobacco. She reports that she drinks about 1.8 oz of alcohol per week. She reports that she does not use illicit drugs.  Past Medical History  Diagnosis Date  . Basal cell carcinoma of left shoulder 05/2005    Past Surgical History  Procedure Laterality Date  . Basal cell carcinoma excision  2007    left shoulder  . Wisdom tooth extraction  age 41    Current Outpatient Prescriptions  Medication Sig Dispense Refill  . NECON 1/35, 28, tablet Take 1 tablet by mouth daily. 3 Package 0   No current facility-administered medications for this visit.    Family History  Problem Relation Age of Onset  . Multiple births Other     had twins  . Hypertension Maternal Grandmother   . Kidney Stones Maternal Grandfather   . Anemia Mother   . Uterine cancer Mother 52    uterus  . Thyroid disease Paternal Grandmother     ROS:  Pertinent items are noted in HPI.  Otherwise, a comprehensive ROS was negative.  Exam:   BP 102/70 mmHg  Pulse 90  Resp 14  Ht 5' 2.5" (1.588 m)  Wt 172 lb 3.2 oz (78.109  kg)  BMI 30.97 kg/m2  LMP 05/12/2015 (Exact Date)    General appearance: alert, cooperative and appears stated age Head: Normocephalic, without obvious abnormality, atraumatic Neck: no adenopathy, supple, symmetrical, trachea midline and thyroid normal to inspection and palpation Lungs: clear to auscultation bilaterally Breasts: normal appearance, no masses or tenderness, Inspection negative, No nipple retraction or dimpling, No nipple discharge or bleeding, No axillary or supraclavicular adenopathy Heart: regular rate and rhythm Abdomen: soft, non-tender; bowel sounds normal; no masses,  no organomegaly Extremities: extremities normal, atraumatic, no cyanosis or edema Skin: Skin color, texture, turgor normal. No rashes or lesions Lymph nodes: Cervical, supraclavicular, and axillary nodes normal. No abnormal inguinal nodes palpated Neurologic: Grossly normal  Pelvic: External genitalia:  no lesions              Urethra:  normal appearing urethra with no masses, tenderness or lesions              Bartholins and Skenes: normal                 Vagina: normal appearing vagina with normal color and discharge, no lesions              Cervix: no lesions  Pap taken: No. Bimanual Exam:  Uterus:  enlarged,  10 weeks size.  Uterus retroverted.  Fibroid along posterior uterine wall just above cervix.                Adnexa: not evaluated              Rectovaginal: Yes.  .  Confirms.              Anus:  normal sphincter tone, no lesions  Chaperone was present for exam.  Assessment:   Well woman visit. Known fibroid uterus. Irregular menses.  On Necon.  FH of uterine cancer in mother. Desire for STD testing.   Plan: Yearly mammogram recommended after age 13.  Recommended self breast exam.  Pap and HR HPV as above. Discussed Calcium, Vitamin D, regular exercise program including cardiovascular and weight bearing exercise. Labs performed.  Yes.  .   See orders.  Routine labs and  STD testing.  Refills given on medications.  Yes.  .  See orders.  Refill of Necon for 12 months.  Return for sonohysterogram to re-evaluate the fibroids and plan for potential hysteroscopic myomectomy.  Follow up annually and prn.   After visit summary provided.

## 2015-05-30 NOTE — Progress Notes (Signed)
After appointment with Dr Quincy Simmonds, appointment for sonohystogram discussed. Patient unable to schedule due to limitations of work schedule. She will call back first thing tomorrow morning to advise if she would like appointment tomorrow afternoon or prefer 06-07-15. Reviewed surgery instruction sheet for planned procedure date of 06-26-15 pending results of sonohystogram. Printed copy of instructions given to patient.

## 2015-05-31 ENCOUNTER — Ambulatory Visit (INDEPENDENT_AMBULATORY_CARE_PROVIDER_SITE_OTHER): Payer: BC Managed Care – PPO | Admitting: Obstetrics and Gynecology

## 2015-05-31 ENCOUNTER — Encounter: Payer: Self-pay | Admitting: Obstetrics and Gynecology

## 2015-05-31 ENCOUNTER — Ambulatory Visit (INDEPENDENT_AMBULATORY_CARE_PROVIDER_SITE_OTHER): Payer: BC Managed Care – PPO

## 2015-05-31 ENCOUNTER — Other Ambulatory Visit: Payer: Self-pay | Admitting: Obstetrics and Gynecology

## 2015-05-31 ENCOUNTER — Telehealth: Payer: Self-pay | Admitting: Obstetrics and Gynecology

## 2015-05-31 VITALS — BP 134/82 | HR 76 | Ht 62.5 in | Wt 172.0 lb

## 2015-05-31 DIAGNOSIS — D259 Leiomyoma of uterus, unspecified: Secondary | ICD-10-CM | POA: Diagnosis not present

## 2015-05-31 DIAGNOSIS — N926 Irregular menstruation, unspecified: Secondary | ICD-10-CM | POA: Diagnosis not present

## 2015-05-31 LAB — TSH: TSH: 1.95 mIU/L

## 2015-05-31 LAB — STD PANEL
HIV 1&2 Ab, 4th Generation: NONREACTIVE
Hepatitis B Surface Ag: NEGATIVE

## 2015-05-31 LAB — HEPATITIS C ANTIBODY: HCV Ab: NEGATIVE

## 2015-05-31 NOTE — Progress Notes (Signed)
Subjective  35 y.o. G0P0 Single Caucasian female here for pelvic ultrasound for sonohysterogram for re-evaluation of fibroids.  Has known submucous fibroid and potentially enlarging posterior uterine fibroid. Irregular cycles on Necon OCP.  Bled entire month of January.  Menses normal in February.   Patient's last menstrual period was 05/12/2015 (exact date).  Objective  Technique:  Both transabdominal and transvaginal ultrasound examinations of the pelvis were performed. Transabdominal technique was performed for global imaging of the pelvis including uterus, ovaries, adnexal regions, and pelvic cul-de-sac. It was necessary to proceed with endovaginal exam following the abdominal ultrasound.  Transabdominal exam to visualize the endometrium and adnexa.  Color and duplex Doppler ultrasound was utilized to evaluate blood flow to the ovaries.  2nd 16 x 17 mm, 3rd 13 x 10 mm. Pelvic ultrasound images and report reviewed with patient.  Uterus - 3 fibroids, largest is 49 mm x 53 mm (was 46 x 32 mm) and is located posteriorly and submucosal in nature.  EMS - 2.06 mm. Submucosal fibroid noted. Ovaries - right ovary 32 x 26 mm thin walled cyst, no abnormal blood flow. Free fluid - yes. Moderate free fluid.        Technique:  Both transabdominal and transvaginal ultrasound examinations of the pelvis were performed. Transabdominal technique was performed for global imaging of the pelvis including uterus, ovaries, adnexal regions, and pelvic cul-de-sac. It was necessary to proceed with endovaginal exam following the abdominal ultrasound.  Transabdominal exam to visualize the endometrium and adnexa.  Color and duplex Doppler ultrasound was utilized to evaluate blood flow to the ovaries.    Procedure - sonohysterogram Consent performed. Speculum placed in vagina. Sterile prep of cervix with Hibiclens. Cannula placed inside endometrial cavity without difficulty. Speculum removed. Sterile saline  injected.     one         filling defect noted- 1.52 x 1.09 cm. Cannula removed. No complication.    Assessment  Irregular cycles on Necon OCPs. Uterine fibroids.  One submucosal fibroid. FH uterine cancer.   Plan  Discussion of fibroids and options for care - medical therapy to control cycles, hysteroscopic myomectomy +/- abdominal myomectomy, uterine artery embolization, hysterectomy. Will proceed with hysteroscopic resection of submucous fibroid with Myosure and also perform dilation and curettage.  Risks, benefits, and alternatives reviewed. Risks include but are not limited to bleeding, infection, damage to surrounding organs including uterine perforation requiring hospitalization and laparoscopy, pulmonary edema, reaction to anesthesia, DVT, PE, death, need for further treatment and surgery including repeat hysteroscopy, hysterectomy or medical therapy.   Surgical expectations and recovery discussed.  Patient wishes to proceed.  __25_____ minutes face to face time of which over 50% was spent in counseling.   After visit summary to patient.

## 2015-05-31 NOTE — Telephone Encounter (Signed)
Called patient to review benefits for a recommended procedure. Left Voicemail requesting a call back. °

## 2015-06-05 LAB — IPS N GONORRHOEA AND CHLAMYDIA BY PCR

## 2015-06-06 ENCOUNTER — Telehealth: Payer: Self-pay

## 2015-06-06 NOTE — Telephone Encounter (Signed)
-----   Message from Nunzio Cobbs, MD sent at 06/05/2015  7:25 PM EDT ----- Please report results to patient.   All STD testing is negative for GC/CT, HIV, syphilis, and hep B and C.   The CBC showed a slightly elevated WBC.  This is a number that can fluctuate a lot.  We will retest this when she has her surgery.  The blood chemistries were unremarkable.  Her sodium was a point below normal. Her thyroid function was normal.  Her cholesterol and triglycerides were elevated.  The ratios of the good and bad cholesterol were acceptable.  She can lower her cholesterol and triglycerides through a diet low in saturated fat and low in cholesterol and by increasing her exercise.  I recommend retesting this yearly.   Cc- Marisa Sprinkles

## 2015-06-06 NOTE — Telephone Encounter (Signed)
Attempted to reach patient at number provided [phone]. There was no answer and recording states that the voicemail box is full and not accepting new messages at this time.

## 2015-06-11 NOTE — Telephone Encounter (Signed)
Disregard.  error

## 2015-06-12 NOTE — Telephone Encounter (Signed)
Attempted to reach patient at number provided [phone]. There was no answer and recording states that the voicemail box is full and not accepting new messages at this time.

## 2015-06-13 ENCOUNTER — Encounter (HOSPITAL_COMMUNITY): Payer: Self-pay | Admitting: *Deleted

## 2015-06-14 NOTE — Telephone Encounter (Signed)
I have been unable to reach this patient x2. Letter sent to patient requesting return call to office to discuss lab results.  Okay to close encounter?

## 2015-06-14 NOTE — Telephone Encounter (Signed)
Thank you for the update!

## 2015-06-25 NOTE — H&P (Signed)
Nunzio Cobbs, MD at 05/30/2015 3:18 PM     Status: Signed       Expand All Collapse All   Patient ID: Zollie Scale, female DOB: 04-24-1980, 35 y.o. MRN: [id number] 35 y.o. G0P0 Single Caucasian female here for annual exam.   On Necon.  Having irregular cycles again.  Bled almost full month in January.  Menses in February normal.  Bleeding again today.   Has known intracavitary fibroid.  Also has a 5 cm posterior subserosal fibroid.  PCP: Sadie Haber @ [facility]   Patient's last menstrual period was 05/12/2015 (exact date).  Sexually active: No. female The current method of family planning is OCP (estrogen/progesterone)--Necon.  Exercising: No.  Smoker: no  Health Maintenance: Pap: 05-19-14 Neg:Neg HR HPV History of abnormal Pap: no MMG: n/a Colonoscopy: n/a BMD: n/a Result n/a TDaP: 04-19-09 Screening Labs: Hb today: PCP, Urine today: moderate RBCs--see LMP--began bleeding in office   reports that she has never smoked. She has never used smokeless tobacco. She reports that she drinks about 1.8 oz of alcohol per week. She reports that she does not use illicit drugs.  Past Medical History  Diagnosis Date  . Basal cell carcinoma of left shoulder 05/2005    Past Surgical History  Procedure Laterality Date  . Basal cell carcinoma excision  2007    left shoulder  . Wisdom tooth extraction  age 23    Current Outpatient Prescriptions  Medication Sig Dispense Refill  . NECON 1/35, 28, tablet Take 1 tablet by mouth daily. 3 Package 0   No current facility-administered medications for this visit.    Family History  Problem Relation Age of Onset  . Multiple births Other     had twins  . Hypertension Maternal Grandmother   . Kidney Stones Maternal Grandfather   . Anemia Mother   . Uterine cancer Mother 56    uterus  . Thyroid disease Paternal Grandmother      ROS: Pertinent items are noted in HPI. Otherwise, a comprehensive ROS was negative.  Exam:  BP 102/70 mmHg  Pulse 90  Resp 14  Ht 5' 2.5" (1.588 m)  Wt 172 lb 3.2 oz (78.109 kg)  BMI 30.97 kg/m2  LMP 05/12/2015 (Exact Date)  General appearance: alert, cooperative and appears stated age Head: Normocephalic, without obvious abnormality, atraumatic Neck: no adenopathy, supple, symmetrical, trachea midline and thyroid normal to inspection and palpation Lungs: clear to auscultation bilaterally Breasts: normal appearance, no masses or tenderness, Inspection negative, No nipple retraction or dimpling, No nipple discharge or bleeding, No axillary or supraclavicular adenopathy Heart: regular rate and rhythm Abdomen: soft, non-tender; bowel sounds normal; no masses, no organomegaly Extremities: extremities normal, atraumatic, no cyanosis or edema Skin: Skin color, texture, turgor normal. No rashes or lesions Lymph nodes: Cervical, supraclavicular, and axillary nodes normal. No abnormal inguinal nodes palpated Neurologic: Grossly normal  Pelvic: External genitalia: no lesions  Urethra: normal appearing urethra with no masses, tenderness or lesions  Bartholins and Skenes: normal   Vagina: normal appearing vagina with normal color and discharge, no lesions  Cervix: no lesions  Pap taken: No. Bimanual Exam: Uterus: enlarged,  10 weeks size. Uterus retroverted. Fibroid along posterior uterine wall just above cervix.   Adnexa: not evaluated  Rectovaginal: Yes. . Confirms.  Anus: normal sphincter tone, no lesions  Chaperone was present for exam.  Assessment:  Well woman visit. Known fibroid uterus. Irregular menses.  On Necon.  FH of uterine cancer in mother.  Desire for STD testing.   Plan: Yearly mammogram recommended after age 84.  Recommended self breast  exam.  Pap and HR HPV as above. Discussed Calcium, Vitamin D, regular exercise program including cardiovascular and weight bearing exercise. Labs performed. Yes. . See orders. Routine labs and STD testing.  Refills given on medications. Yes. See orders. Refill of Necon for 12 months.  Return for sonohysterogram to re-evaluate the fibroids and plan for potential hysteroscopic myomectomy.  Follow up annually and prn.   After visit summary provided.           Addendum -  Progress Notes      Nunzio Cobbs, MD at 05/31/2015 5:14 PM     Status: Signed       Expand All Collapse All   Subjective  35 y.o. G0P0 Single Caucasian female here for pelvic ultrasound for sonohysterogram for re-evaluation of fibroids.  Has known submucous fibroid and potentially enlarging posterior uterine fibroid. Irregular cycles on Necon OCP.  Bled entire month of January.  Menses normal in February.  Patient's last menstrual period was 05/12/2015 (exact date).  Objective  Technique: Both transabdominal and transvaginal ultrasound examinations of the pelvis were performed. Transabdominal technique was performed for global imaging of the pelvis including uterus, ovaries, adnexal regions, and pelvic cul-de-sac. It was necessary to proceed with endovaginal exam following the abdominal ultrasound. Transabdominal exam to visualize the endometrium and adnexa. Color and duplex Doppler ultrasound was utilized to evaluate blood flow to the ovaries.    Pelvic ultrasound images and report reviewed with patient.  Uterus - 3 fibroids, largest is 49 mm x 53 mm (was 46 x 32 mm) and is located posteriorly and submucosal in nature. 2nd fibroid: 16 x 17 mm submucosal, 3rd fibroid: 13 x 10 mm subserosal. EMS - 2.06 mm. Submucosal fibroid noted. Ovaries - right ovary 32 x 26 mm thin walled cyst, no abnormal blood flow. Free fluid - yes. Moderate free fluid.      Procedure -  sonohysterogram Consent performed. Speculum placed in vagina. Sterile prep of cervix with Hibiclens. Cannula placed inside endometrial cavity without difficulty. Speculum removed. Sterile saline injected.  one filling defect noted- 1.52 x 1.09 cm. Cannula removed. No complication.   Assessment  Irregular cycles on Necon OCPs. Uterine fibroids. One submucosal fibroid. FH uterine cancer.   Plan  Discussion of fibroids and options for care - medical therapy to control cycles, hysteroscopic myomectomy +/- abdominal myomectomy, uterine artery embolization, hysterectomy. Will proceed with hysteroscopic resection of submucous fibroid with Myosure and also perform dilation and curettage.  Risks, benefits, and alternatives reviewed. Risks include but are not limited to bleeding, infection, damage to surrounding organs including uterine perforation requiring hospitalization and laparoscopy, pulmonary edema, reaction to anesthesia, DVT, PE, death, need for further treatment and surgery including repeat hysteroscopy, hysterectomy or medical therapy.  Surgical expectations and recovery discussed.  Patient wishes to proceed.  __25_____ minutes face to face time of which over 50% was spent in counseling.   After visit summary to patient.

## 2015-06-26 ENCOUNTER — Ambulatory Visit (HOSPITAL_COMMUNITY): Payer: BC Managed Care – PPO | Admitting: Anesthesiology

## 2015-06-26 ENCOUNTER — Ambulatory Visit (HOSPITAL_COMMUNITY)
Admission: RE | Admit: 2015-06-26 | Discharge: 2015-06-26 | Disposition: A | Discharge status: Home / Self Care | Payer: BC Managed Care – PPO | Source: Ambulatory Visit | Attending: Obstetrics and Gynecology | Admitting: Obstetrics and Gynecology

## 2015-06-26 ENCOUNTER — Encounter (HOSPITAL_COMMUNITY): Payer: Self-pay | Admitting: Emergency Medicine

## 2015-06-26 ENCOUNTER — Encounter (HOSPITAL_COMMUNITY)
Admission: RE | Disposition: A | Discharge status: Home / Self Care | Payer: Self-pay | Source: Ambulatory Visit | Attending: Obstetrics and Gynecology

## 2015-06-26 DIAGNOSIS — Z8049 Family history of malignant neoplasm of other genital organs: Secondary | ICD-10-CM | POA: Diagnosis not present

## 2015-06-26 DIAGNOSIS — D250 Submucous leiomyoma of uterus: Secondary | ICD-10-CM | POA: Insufficient documentation

## 2015-06-26 DIAGNOSIS — Z79899 Other long term (current) drug therapy: Secondary | ICD-10-CM | POA: Diagnosis not present

## 2015-06-26 DIAGNOSIS — Z85828 Personal history of other malignant neoplasm of skin: Secondary | ICD-10-CM | POA: Diagnosis not present

## 2015-06-26 DIAGNOSIS — N939 Abnormal uterine and vaginal bleeding, unspecified: Secondary | ICD-10-CM | POA: Insufficient documentation

## 2015-06-26 HISTORY — DX: Other complications of anesthesia, initial encounter: T88.59XA

## 2015-06-26 HISTORY — PX: DILATATION & CURETTAGE/HYSTEROSCOPY WITH MYOSURE: SHX6511

## 2015-06-26 HISTORY — DX: Anemia, unspecified: D64.9

## 2015-06-26 HISTORY — DX: Nausea with vomiting, unspecified: R11.2

## 2015-06-26 HISTORY — DX: Adverse effect of unspecified anesthetic, initial encounter: T41.45XA

## 2015-06-26 HISTORY — DX: Other specified postprocedural states: Z98.890

## 2015-06-26 LAB — TYPE AND SCREEN
ABO/RH(D): O POS
Antibody Screen: NEGATIVE

## 2015-06-26 LAB — CBC WITH DIFFERENTIAL/PLATELET
BASOS PCT: 1 %
Basophils Absolute: 0.1 10*3/uL (ref 0.0–0.1)
EOS ABS: 0.3 10*3/uL (ref 0.0–0.7)
EOS PCT: 3 %
HCT: 36.3 % (ref 36.0–46.0)
Hemoglobin: 12.1 g/dL (ref 12.0–15.0)
LYMPHS ABS: 3.3 10*3/uL (ref 0.7–4.0)
Lymphocytes Relative: 32 %
MCH: 30.1 pg (ref 26.0–34.0)
MCHC: 33.3 g/dL (ref 30.0–36.0)
MCV: 90.3 fL (ref 78.0–100.0)
MONOS PCT: 5 %
Monocytes Absolute: 0.5 10*3/uL (ref 0.1–1.0)
Neutro Abs: 6.1 10*3/uL (ref 1.7–7.7)
Neutrophils Relative %: 59 %
PLATELETS: 339 10*3/uL (ref 150–400)
RBC: 4.02 MIL/uL (ref 3.87–5.11)
RDW: 12.8 % (ref 11.5–15.5)
WBC: 10.3 10*3/uL (ref 4.0–10.5)

## 2015-06-26 LAB — PREGNANCY, URINE: Preg Test, Ur: NEGATIVE

## 2015-06-26 LAB — ABO/RH: ABO/RH(D): O POS

## 2015-06-26 SURGERY — DILATATION & CURETTAGE/HYSTEROSCOPY WITH MYOSURE
Anesthesia: General

## 2015-06-26 MED ORDER — MIDAZOLAM HCL 2 MG/2ML IJ SOLN
INTRAMUSCULAR | Status: AC
  Filled 2015-06-26: qty 2

## 2015-06-26 MED ORDER — LIDOCAINE HCL 1 % IJ SOLN
INTRAMUSCULAR | Status: AC
  Filled 2015-06-26: qty 20

## 2015-06-26 MED ORDER — LIDOCAINE HCL 1 % IJ SOLN
INTRAMUSCULAR | Status: DC | PRN
  Administered 2015-06-26: 10 mL

## 2015-06-26 MED ORDER — MIDAZOLAM HCL 2 MG/2ML IJ SOLN
INTRAMUSCULAR | Status: DC | PRN
  Administered 2015-06-26: 1 mg via INTRAVENOUS

## 2015-06-26 MED ORDER — DEXAMETHASONE SODIUM PHOSPHATE 10 MG/ML IJ SOLN
INTRAMUSCULAR | Status: DC | PRN
  Administered 2015-06-26: 4 mg via INTRAVENOUS

## 2015-06-26 MED ORDER — LIDOCAINE HCL (CARDIAC) 20 MG/ML IV SOLN
INTRAVENOUS | Status: DC | PRN
Start: 2015-06-26 — End: 2015-06-26
  Administered 2015-06-26: 70 mg via INTRAVENOUS
  Administered 2015-06-26: 30 mg via INTRAVENOUS

## 2015-06-26 MED ORDER — SCOPOLAMINE 1 MG/3DAYS TD PT72
1.0000 | MEDICATED_PATCH | Freq: Once | TRANSDERMAL | Status: DC
  Administered 2015-06-26: 1.5 mg via TRANSDERMAL

## 2015-06-26 MED ORDER — OXYCODONE HCL 5 MG/5ML PO SOLN: 5.0000 mg | Freq: Once | ORAL | Status: DC | PRN

## 2015-06-26 MED ORDER — ONDANSETRON HCL 4 MG/2ML IJ SOLN: 4.0000 mg | Freq: Once | INTRAMUSCULAR | Status: DC | PRN

## 2015-06-26 MED ORDER — LIDOCAINE HCL (CARDIAC) 20 MG/ML IV SOLN
INTRAVENOUS | Status: AC
  Filled 2015-06-26: qty 5

## 2015-06-26 MED ORDER — LACTATED RINGERS IV SOLN: INTRAVENOUS | Status: DC

## 2015-06-26 MED ORDER — LACTATED RINGERS IV SOLN
INTRAVENOUS | Status: DC
  Administered 2015-06-26 (×2): via INTRAVENOUS

## 2015-06-26 MED ORDER — DEXAMETHASONE SODIUM PHOSPHATE 10 MG/ML IJ SOLN
INTRAMUSCULAR | Status: AC
  Filled 2015-06-26: qty 1

## 2015-06-26 MED ORDER — ONDANSETRON HCL 4 MG/2ML IJ SOLN
INTRAMUSCULAR | Status: AC
  Filled 2015-06-26: qty 2

## 2015-06-26 MED ORDER — SODIUM CHLORIDE 0.9 % IR SOLN
Status: DC | PRN
  Administered 2015-06-26: 3000 mL

## 2015-06-26 MED ORDER — FENTANYL CITRATE (PF) 100 MCG/2ML IJ SOLN
INTRAMUSCULAR | Status: AC
  Filled 2015-06-26: qty 2

## 2015-06-26 MED ORDER — PROPOFOL 10 MG/ML IV BOLUS
INTRAVENOUS | Status: AC
  Filled 2015-06-26: qty 20

## 2015-06-26 MED ORDER — FENTANYL CITRATE (PF) 100 MCG/2ML IJ SOLN
INTRAMUSCULAR | Status: AC
  Administered 2015-06-26: 50 ug via INTRAVENOUS
  Filled 2015-06-26: qty 2

## 2015-06-26 MED ORDER — KETOROLAC TROMETHAMINE 30 MG/ML IJ SOLN
INTRAMUSCULAR | Status: DC | PRN
  Administered 2015-06-26: 30 mg via INTRAVENOUS

## 2015-06-26 MED ORDER — PROPOFOL 10 MG/ML IV BOLUS
INTRAVENOUS | Status: DC | PRN
  Administered 2015-06-26: 18 mg via INTRAVENOUS

## 2015-06-26 MED ORDER — CEFAZOLIN SODIUM-DEXTROSE 2-4 GM/100ML-% IV SOLN
2.0000 g | Freq: Once | INTRAVENOUS | Status: AC
  Administered 2015-06-26: 2 g via INTRAVENOUS
  Filled 2015-06-26: qty 100

## 2015-06-26 MED ORDER — FENTANYL CITRATE (PF) 100 MCG/2ML IJ SOLN
INTRAMUSCULAR | Status: DC | PRN
  Administered 2015-06-26 (×2): 50 ug via INTRAVENOUS

## 2015-06-26 MED ORDER — SCOPOLAMINE 1 MG/3DAYS TD PT72
MEDICATED_PATCH | TRANSDERMAL | Status: AC
  Administered 2015-06-26: 1.5 mg via TRANSDERMAL
  Filled 2015-06-26: qty 1

## 2015-06-26 MED ORDER — KETOROLAC TROMETHAMINE 30 MG/ML IJ SOLN
INTRAMUSCULAR | Status: AC
  Filled 2015-06-26: qty 1

## 2015-06-26 MED ORDER — OXYCODONE HCL 5 MG PO TABS: 5.0000 mg | ORAL_TABLET | Freq: Once | ORAL | Status: DC | PRN

## 2015-06-26 MED ORDER — ONDANSETRON HCL 4 MG/2ML IJ SOLN
INTRAMUSCULAR | Status: DC | PRN
  Administered 2015-06-26: 4 mg via INTRAVENOUS

## 2015-06-26 MED ORDER — SUGAMMADEX SODIUM 200 MG/2ML IV SOLN
INTRAVENOUS | Status: AC
  Filled 2015-06-26: qty 2

## 2015-06-26 MED ORDER — CEFAZOLIN SODIUM-DEXTROSE 2-3 GM-% IV SOLR
INTRAVENOUS | Status: AC
  Filled 2015-06-26: qty 50

## 2015-06-26 MED ORDER — FENTANYL CITRATE (PF) 100 MCG/2ML IJ SOLN
25.0000 ug | INTRAMUSCULAR | Status: DC | PRN
  Administered 2015-06-26: 50 ug via INTRAVENOUS

## 2015-06-26 SURGICAL SUPPLY — 21 items
CANISTER SUCT 3000ML (MISCELLANEOUS) ×3 IMPLANT
CATH ROBINSON RED A/P 16FR (CATHETERS) ×3 IMPLANT
CLOTH BEACON ORANGE TIMEOUT ST (SAFETY) ×3 IMPLANT
CONTAINER PREFILL 10% NBF 60ML (FORM) ×6 IMPLANT
DEVICE MYOSURE LITE (MISCELLANEOUS) IMPLANT
DEVICE MYOSURE REACH (MISCELLANEOUS) ×3 IMPLANT
ELECT REM PT RETURN 9FT ADLT (ELECTROSURGICAL)
ELECTRODE REM PT RTRN 9FT ADLT (ELECTROSURGICAL) IMPLANT
FILTER ARTHROSCOPY CONVERTOR (FILTER) ×3 IMPLANT
GLOVE BIO SURGEON STRL SZ 6.5 (GLOVE) ×2 IMPLANT
GLOVE BIO SURGEONS STRL SZ 6.5 (GLOVE) ×1
GLOVE BIOGEL PI IND STRL 7.0 (GLOVE) ×2 IMPLANT
GLOVE BIOGEL PI INDICATOR 7.0 (GLOVE) ×4
GOWN STRL REUS W/TWL LRG LVL3 (GOWN DISPOSABLE) ×6 IMPLANT
PACK VAGINAL MINOR WOMEN LF (CUSTOM PROCEDURE TRAY) ×3 IMPLANT
PAD OB MATERNITY 4.3X12.25 (PERSONAL CARE ITEMS) ×3 IMPLANT
SEAL ROD LENS SCOPE MYOSURE (ABLATOR) ×3 IMPLANT
TOWEL OR 17X24 6PK STRL BLUE (TOWEL DISPOSABLE) ×6 IMPLANT
TUBING AQUILEX INFLOW (TUBING) ×3 IMPLANT
TUBING AQUILEX OUTFLOW (TUBING) ×3 IMPLANT
WATER STERILE IRR 1000ML POUR (IV SOLUTION) ×3 IMPLANT

## 2015-06-26 NOTE — Transfer of Care (Signed)
Immediate Anesthesia Transfer of Care Note  Patient: Mariah Nichols  Procedure(s) Performed: Procedure(s) with comments: DILATATION & CURETTAGE/HYSTEROSCOPY WITH MYOSURE RESECTION OF SUBMUCOUS FIBROID  (N/A) - fibroid resection  Patient Location: PACU  Anesthesia Type:General  Level of Consciousness: awake, alert  and oriented  Airway & Oxygen Therapy: Patient Spontanous Breathing and Patient connected to nasal cannula oxygen  Post-op Assessment: Report given to RN and Post -op Vital signs reviewed and stable  Post vital signs: Reviewed and stable  Last Vitals:  Filed Vitals:   06/26/15 1211  BP: 109/76  Pulse: 80  Temp: 36.7 C  Resp: 16    Complications: No apparent anesthesia complications

## 2015-06-26 NOTE — Anesthesia Procedure Notes (Signed)
Procedure Name: LMA Insertion Date/Time: 06/26/2015 2:33 PM Performed by: Tobin Chad Pre-anesthesia Checklist: Patient identified, Emergency Drugs available, Suction available and Patient being monitored Patient Re-evaluated:Patient Re-evaluated prior to inductionOxygen Delivery Method: Circle system utilized and Simple face mask Preoxygenation: Pre-oxygenation with 100% oxygen Intubation Type: IV induction LMA: LMA inserted LMA Size: 4.0 Grade View: Grade II Number of attempts: 1 Placement Confirmation: positive ETCO2 and breath sounds checked- equal and bilateral

## 2015-06-26 NOTE — Anesthesia Postprocedure Evaluation (Signed)
Anesthesia Post Note  Patient: Mariah Nichols  Procedure(s) Performed: Procedure(s) (LRB): DILATATION & CURETTAGE/HYSTEROSCOPY WITH MYOSURE RESECTION OF SUBMUCOUS FIBROID  (N/A)  Patient location during evaluation: PACU Anesthesia Type: General Level of consciousness: awake Pain management: pain level controlled Vital Signs Assessment: post-procedure vital signs reviewed and stable Respiratory status: spontaneous breathing Cardiovascular status: stable Postop Assessment: no signs of nausea or vomiting Anesthetic complications: no    Last Vitals:  Filed Vitals:   06/26/15 1515 06/26/15 1536  BP: 130/88   Pulse: 84 71  Temp:    Resp: 16 17    Last Pain: There were no vitals filed for this visit.               [place]

## 2015-06-26 NOTE — Op Note (Signed)
OPERATIVE REPORT   PREOPERATIVE DIAGNOSES:   Abnormal uterine bleeding, submucous fibroid.  POSTOPERATIVE DIAGNOSES:   Abnormal uterine bleeding, submucous fibroid, potential arcuate uterus.  PROCEDURE:  Hysteroscopy with dilation and curettage and Myosure resection of submucous fibroid.  SURGEON:  Lenard Galloway, MD  ANESTHESIA:  LMA, paracervical block with 10 mL of 1% lidocaine.  IV FLUIDS:   1500 cc LR.  EBL:  15 cc.  URINE OUTPUT: 100 cc.  NORMAL SALINE DEFICIT:   400 cc.  COMPLICATIONS:  None.  INDICATIONS FOR THE PROCEDURE:     The patient is a 35 year old Go Caucasian female with irregular bleeding on oral contraceptives.  She has known fibroids and sonohysterogram confirming a 1.52 x 1.09 cm intracavitary fibroid.  A plan is now made to proceed with a hysteroscopy with dilation and curettage and Myosure resection of submucous fibroid; after risks, benefits and alternatives were reviewed.  FINDINGS:  Exam under anesthesia revealed a 10 week size globular uterus.  No adnexal masses were palpable. The uterus was sounded to 9 cm. Hysteroscopy showed a 1.5 cm right cornual fundal fibroid.  There appeared to be a potential arcuate uterine shape.  The tubal ostial regions were normal.  Endometrial currettings were scant.  SPECIMENS:  The submucous fibroid and endometrial curettings were sent to Pathology separately.   PROCEDURE IN DETAIL:  The patient was reidentified in the preoperative hold area.  She did receive Ancef IV for antibiotic prophylaxis and she received TED hose and PAS stockings for DVT prophylaxis.  In the operating room, the patient was placed in the dorsal lithotomy position and then an LMA anesthetic was introduced.  The patient's lower abdomen, vagina and perineum were sterilely prepped with Betadine and the  patient's bladder was catheterized of urine.  She was sterilly draped  An exam under anesthesia was performed.  A  speculum was placed inside the vagina and a single-tooth tenaculum was placed on the anterior cervical lip.  A paracervical block was performed with a total of 10 mL of 1% lidocaine plain.  The uterus was sounded. The cervix was dilated to a #21 Pratt dilator.  The MyoSure hysteroscope was then inserted inside the uterine cavity under the continuous infusion of normal saline solution.  Findings are as noted above.  The MyoSure hysteroscope was used to resect the fibroid.  The fluid inflow was stopped  and then reinitiated to evaluate the uterine cavity further.  More fibroid appeared in the cavity.  The Myosure device was used to complete the resection of the  fibroid.The Myosure was then removed, and the pieces of fibroid were sent to  pathology.  The cervix was further dilated to a #25 Pratt dilator.  The sharp and  then a serrated curette were introduced into the uterine cavity and the  endometrium was curetted in all 4 quadrants.  A minimal amount of endometrial  curettings was obtained.  This specimen was sent to Pathology separately.  The single-tooth tenaculum which had been placed on the anterior cervical lip was removed.  Hemostasis was good, and all of the vaginal instruments were removed.  The patient was awakened and escorted to the recovery room in stable condition after she was cleansed with Betadine.  There were no complications to the procedure.  All needle, instrument and sponge counts were correct.  Lenard Galloway, MD

## 2015-06-26 NOTE — Discharge Instructions (Addendum)
Mariah Nichols,   I was able to remove the fibroid in the uterine cavity.    Please take Ibuprofen 800 mg every 8 hours if you have cramping.   I will see you in 2 weeks in the office for your previously scheduled appointment.   Thank you!  Mariah Half, MD   Hysteroscopy, Care After Refer to this sheet in the next few weeks. These instructions provide you with information on caring for yourself after your procedure. Your health care provider may also give you more specific instructions. Your treatment has been planned according to current medical practices, but problems sometimes occur. Call your health care provider if you have any problems or questions after your procedure.  WHAT TO EXPECT AFTER THE PROCEDURE After your procedure, it is typical to have the following:  You may have some cramping. This normally lasts for a couple days.  You may have bleeding. This can vary from light spotting for a few days to menstrual-like bleeding for 3-7 days. HOME CARE INSTRUCTIONS  Rest for the first 1-2 days after the procedure.  Only take over-the-counter or prescription medicines as directed by your health care provider. Do not take aspirin. It can increase the chances of bleeding.  Take showers instead of baths for 2 weeks or as directed by your health care provider.  Do not drive for 24 hours or as directed.  Do not drink alcohol while taking pain medicine.  Do not use tampons, douche, or have sexual intercourse for 2 weeks or until your health care provider says it is okay.  Take your temperature twice a day for 4-5 days. Write it down each time.  Follow your health care provider's advice about diet, exercise, and lifting.  If you develop constipation, you may:  Take a mild laxative if your health care provider approves.  Add bran foods to your diet.  Drink enough fluids to keep your urine clear or pale yellow.  Try to have someone with you or available to you for the first 24-48  hours, especially if you were given a general anesthetic.  Follow up with your health care provider as directed. SEEK MEDICAL CARE IF:  You feel dizzy or lightheaded.  You feel sick to your stomach (nauseous).  You have abnormal vaginal discharge.  You have a rash.  You have pain that is not controlled with medicine. SEEK IMMEDIATE MEDICAL CARE IF:  You have bleeding that is heavier than a normal menstrual period.  You have a fever.  You have increasing cramps or pain, not controlled with medicine.  You have new belly (abdominal) pain.  You pass out.  You have pain in the tops of your shoulders (shoulder strap areas).  You have shortness of breath.   This information is not intended to replace advice given to you by your health care provider. Make sure you discuss any questions you have with your health care provider.   Document Released: 12/22/2012 Document Reviewed: 12/22/2012 Elsevier Interactive Patient Education 2016 [place]: D&C / D&E The following instructions have been prepared to help you care for yourself upon your return home.   Personal hygiene:  Use sanitary pads for vaginal drainage, not tampons.  Shower the day after your procedure.  NO tub baths, pools or Jacuzzis for 2-3 weeks.  Wipe front to back after using the bathroom.  Activity and limitations:  Do NOT drive or operate any equipment for 24 hours. The effects of anesthesia are still present and drowsiness  may result.  Do NOT rest in bed all day.  Walking is encouraged.  Walk up and down stairs slowly.  You may resume your normal activity in one to two days or as indicated by your physician.  Sexual activity: NO intercourse for at least 2 weeks after the procedure, or as indicated by your physician.  Diet: Eat a light meal as desired this evening. You may resume your usual diet tomorrow.  Return to work: You may resume your work activities in one to two  days or as indicated by your doctor.  What to expect after your surgery: Expect to have vaginal bleeding/discharge for 2-3 days and spotting for up to 10 days. It is not unusual to have soreness for up to 1-2 weeks. You may have a slight burning sensation when you urinate for the first day. Mild cramps may continue for a couple of days. You may have a regular period in 2-6 weeks.  Call your doctor for any of the following:  Excessive vaginal bleeding, saturating and changing one pad every hour.  Inability to urinate 6 hours after discharge from hospital.  Pain not relieved by pain medication.  Fever of 100.4 F or greater.  Unusual vaginal discharge or odor.   Call for an appointment:    Patients signature: ______________________  Nurses signature ________________________  Support person's signature_______________________

## 2015-06-26 NOTE — Anesthesia Preprocedure Evaluation (Signed)
Anesthesia Evaluation  Patient identified by MRN, date of birth, ID band Patient awake    Reviewed: Allergy & Precautions, NPO status , Patient's Chart, lab work & pertinent test results  Airway Mallampati: II  TM Distance: >3 FB     Dental  (+) Teeth Intact   Pulmonary    breath sounds clear to auscultation       Cardiovascular  Rhythm:Regular Rate:Normal     Neuro/Psych    GI/Hepatic   Endo/Other    Renal/GU      Musculoskeletal   Abdominal   Peds  Hematology   Anesthesia Other Findings   Reproductive/Obstetrics                             Anesthesia Physical Anesthesia Plan  ASA: II  Anesthesia Plan: General   Post-op Pain Management:    Induction: Intravenous  Airway Management Planned: LMA  Additional Equipment:   Intra-op Plan:   Post-operative Plan:   Informed Consent: I have reviewed the patients History and Physical, chart, labs and discussed the procedure including the risks, benefits and alternatives for the proposed anesthesia with the patient or authorized representative who has indicated his/her understanding and acceptance.   Dental advisory given  Plan Discussed with: CRNA and Anesthesiologist  Anesthesia Plan Comments:         Anesthesia Quick Evaluation

## 2015-06-26 NOTE — Progress Notes (Signed)
Update Note  No marked change in statu since office preop visit.  Patient examined.  OK to proceed with surgery.

## 2015-06-27 ENCOUNTER — Encounter (HOSPITAL_COMMUNITY): Payer: Self-pay | Admitting: Obstetrics and Gynecology

## 2015-07-02 ENCOUNTER — Telehealth: Payer: Self-pay

## 2015-07-02 ENCOUNTER — Telehealth: Payer: Self-pay | Admitting: *Deleted

## 2015-07-02 NOTE — Telephone Encounter (Signed)
-----   Message from Nunzio Cobbs, MD sent at 07/01/2015  1:10 PM EDT ----- Please let patient know her surgical pathology has returned showing a benign fibroid and a benign endometrial polyp. She had a hysteroscopic myomectomy and dilation and curettage.

## 2015-07-02 NOTE — Telephone Encounter (Signed)
Left message to call Kaitlyn at [phone]. 

## 2015-07-02 NOTE — Telephone Encounter (Signed)
Fax from [place] states:  Brand Necon Tab 1/35 is mfr discontinued. Please authorize generic Cyclafem Tab 1/35 retaining all other information as is. If not provide a new prescription.  05/30/15 Necon 1/35 was sent to [facility] at [facility]   Please advise.

## 2015-07-03 NOTE — Telephone Encounter (Signed)
Spoke with patient. Results given as seen below. Patient is agreeable and verbalizes understanding.  Routing to provider for final review. Patient agreeable to disposition. Will close encounter   

## 2015-07-03 NOTE — Telephone Encounter (Signed)
[facility] for Cyclafem 1/35 for 11 months.

## 2015-07-04 ENCOUNTER — Telehealth: Payer: Self-pay

## 2015-07-04 MED ORDER — NORETHINDRONE-ETH ESTRADIOL 1-35 MG-MCG PO TABS: 1.0000 | ORAL_TABLET | Freq: Every day | ORAL | Status: DC

## 2015-07-04 NOTE — Telephone Encounter (Signed)
Thank you.  I have closed the encounter. 

## 2015-07-04 NOTE — Telephone Encounter (Signed)
Request from pharmacy ([facility]) asking to prescribe an alternative therapy for the patient. Brand NECON TABS 1/35 is mfr discontinued. Pharmacy form sent to provider for approval. Provider approved (signed) for generic Cyclafem tablets 1/35 #90 with 3refills.

## 2015-07-04 NOTE — Telephone Encounter (Signed)
Prescription sent per Dr. Quincy Simmonds.

## 2015-07-04 NOTE — Telephone Encounter (Signed)
I think there may be multiple orders for this? I will sign again.

## 2015-07-04 NOTE — Telephone Encounter (Signed)
Cyclafem 1/35 #3 pack/3 refills sent to [facility].  Routed to provider for review, encounter closed.

## 2015-07-04 NOTE — Telephone Encounter (Signed)
Just notifying you that prescription was sent.

## 2015-07-11 ENCOUNTER — Ambulatory Visit (INDEPENDENT_AMBULATORY_CARE_PROVIDER_SITE_OTHER): Payer: BC Managed Care – PPO | Admitting: Obstetrics and Gynecology

## 2015-07-11 ENCOUNTER — Encounter: Payer: Self-pay | Admitting: Obstetrics and Gynecology

## 2015-07-11 VITALS — BP 110/76 | HR 50 | Ht 62.5 in | Wt 177.2 lb

## 2015-07-11 DIAGNOSIS — D250 Submucous leiomyoma of uterus: Secondary | ICD-10-CM

## 2015-07-11 NOTE — Progress Notes (Signed)
Patient ID: Mariah Nichols, female   DOB: 12-13-1980, 35 y.o.   MRN: [id number] GYNECOLOGY  VISIT   HPI: 35 y.o.   Single  Caucasian  female   G0P0 with Patient's last menstrual period was 05/12/2015 (exact date).   here for 2 week follow up [place] (N/A   Not much bleeding.  Cramping for the first day only.  Takes Necon 1/35 and this was just substituted for Cylafem.   Having cough and has bronchitis once per year.  Feeling poorly for one week.  Breathing problems work her up this week. Unsure if having fever, but thinks she may have one.  Just took cold medication.   PCP is [facility].  GYNECOLOGIC HISTORY: Patient's last menstrual period was 05/12/2015 (exact date). Contraception:  OCPs Menopausal hormone therapy:  n/a Last mammogram:   n/a Last pap smear:  05-19-14 Neg:Neg HR HPV          OB History    Gravida Para Term Preterm AB TAB SAB Ectopic Multiple Living   0 0                 There are no active problems to display for this patient.   Past Medical History  Diagnosis Date  . Basal cell carcinoma of left shoulder 05/2005  . Complication of anesthesia   . PONV (postoperative nausea and vomiting)   . Anemia     BORDERLINE    Past Surgical History  Procedure Laterality Date  . Basal cell carcinoma excision  2007    left shoulder  . Wisdom tooth extraction  age 29  . Dilatation & curettage/hysteroscopy with myosure N/A 06/26/2015    Procedure: DILATATION & CURETTAGE/HYSTEROSCOPY WITH MYOSURE RESECTION OF SUBMUCOUS FIBROID ;  Surgeon: Nunzio Cobbs, MD;  Location: [facility];  Service: Gynecology;  Laterality: N/A;  fibroid resection    Current Outpatient Prescriptions  Medication Sig Dispense Refill  . fexofenadine (ALLEGRA) 180 MG tablet Take 180 mg by mouth daily.    . norethindrone-ethinyl estradiol 1/35 (ORTHO-NOVUM, NORTREL,CYCLAFEM) tablet Take 1 tablet by mouth  daily. 3 Package 3   No current facility-administered medications for this visit.     ALLERGIES: Review of patient's allergies indicates no known allergies.  Family History  Problem Relation Age of Onset  . Multiple births Other     had twins  . Hypertension Maternal Grandmother   . Kidney Stones Maternal Grandfather   . Anemia Mother   . Uterine cancer Mother 52    uterus  . Thyroid disease Paternal Grandmother     Social History   Social History  . Marital Status: Single    Spouse Name: N/A  . Number of Children: 0  . Years of Education: N/A   Occupational History  . Not on file.   Social History Main Topics  . Smoking status: Never Smoker   . Smokeless tobacco: Never Used  . Alcohol Use: 1.8 oz/week    3 Standard drinks or equivalent per week     Comment: 1-3 GLASSES PER WEEK  . Drug Use: No  . Sexual Activity:    Partners: Male    Birth Control/ Protection: Pill     Comment: Necon 1/35   Other Topics Concern  . Not on file   Social History Narrative    ROS:  Pertinent items are noted in HPI.  PHYSICAL EXAMINATION:    BP 110/76 mmHg  Pulse  50  Ht 5' 2.5" (1.588 m)  Wt 177 lb 3.2 oz (80.377 kg)  BMI 31.87 kg/m2  LMP 05/12/2015 (Exact Date)    General appearance: alert, cooperative and appears stated age  Pelvic: External genitalia:  no lesions              Urethra:  normal appearing urethra with no masses, tenderness or lesions              Bartholins and Skenes: normal                 Vagina: normal appearing vagina with normal color and discharge, no lesions              Cervix: no lesions                Bimanual Exam:  Uterus:  normal size, contour, position, consistency, mobility, non-tender              Adnexa: normal adnexa and no mass, fullness, tenderness               Chaperone was present for exam.  ASSESSMENT  Status post hysteroscopic resection of fibroid. Respiratory infection. Allergies.   PLAN  Surgical findings, photos,  and pathology reviewed with patient.  Discussion of potential recurrence of fibroids.  Will follow clinically.  OK to continue with combined OCPs.  Necon appears to be discontinued.  Follow up for annual exam in March 2018.  See PCP for respiratory illness.    An After Visit Summary was printed and given to the patient.

## 2016-06-02 ENCOUNTER — Ambulatory Visit (INDEPENDENT_AMBULATORY_CARE_PROVIDER_SITE_OTHER): Payer: BC Managed Care – PPO | Admitting: Obstetrics and Gynecology

## 2016-06-02 ENCOUNTER — Encounter: Payer: Self-pay | Admitting: Obstetrics and Gynecology

## 2016-06-02 VITALS — BP 120/76 | HR 100 | Resp 20 | Ht 62.5 in | Wt 181.0 lb

## 2016-06-02 DIAGNOSIS — Z01419 Encounter for gynecological examination (general) (routine) without abnormal findings: Secondary | ICD-10-CM

## 2016-06-02 MED ORDER — NORETHINDRONE-ETH ESTRADIOL 1-35 MG-MCG PO TABS
1.0000 | ORAL_TABLET | Freq: Every day | ORAL | 3 refills | Status: DC
Start: 2016-06-02 — End: 2017-05-22

## 2016-06-02 NOTE — Progress Notes (Signed)
36 y.o. G0P0 Single Caucasian female here for annual exam.    No menstrual problems since having hysteroscopic fibroids resection done last year.  Has uterine fibroid remaining - 5 cm posterior subserosal.   No new partners.  Declines STD testing.   Takes Women's One a Day MVI with iron,  Hair, skin and nail vitamin.   Does labs through PCP office.   PCP:  Kathyrn Lass, MD - [facility].  Patient's last menstrual period was 05/25/2016 (exact date).     Period Cycle (Days): 30 Period Duration (Days): 3-4 Period Pattern: Regular Menstrual Flow: Light Menstrual Control: Panty liner Dysmenorrhea: (!) Mild Dysmenorrhea Symptoms: Cramping     Sexually active: No. female The current method of family planning is OCP (estrogen/progesterone)--Ortho Novum.    Exercising: No.   Smoker:  no  Health Maintenance: Pap: 05-19-14 Neg:neg HR HPV; 05-11-12 Neg History of abnormal Pap:  no MMG:  n/a Colonoscopy:  n/a BMD:   n/a  Result  n/a TDaP:  04-19-09 Gardasil:   no Screening Labs:  Hb today: PCP, Urine today: not done   reports that she has never smoked. She has never used smokeless tobacco. She reports that she drinks about 1.8 oz of alcohol per week . She reports that she does not use drugs.  Past Medical History:  Diagnosis Date  . Anemia    BORDERLINE  . Basal cell carcinoma of left shoulder 05/2005  . Complication of anesthesia   . PONV (postoperative nausea and vomiting)     Past Surgical History:  Procedure Laterality Date  . BASAL CELL CARCINOMA EXCISION  2007   left shoulder  . DILATATION & CURETTAGE/HYSTEROSCOPY WITH MYOSURE N/A 06/26/2015   Procedure: DILATATION & CURETTAGE/HYSTEROSCOPY WITH MYOSURE RESECTION OF SUBMUCOUS FIBROID ;  Surgeon: Nunzio Cobbs, MD;  Location: [facility];  Service: Gynecology;  Laterality: N/A;  fibroid resection  . WISDOM TOOTH EXTRACTION  age 16    Current Outpatient Prescriptions  Medication Sig Dispense Refill  . fluticasone (FLONASE)  50 MCG/ACT nasal spray Place 1 spray into both nostrils daily.    . norethindrone-ethinyl estradiol 1/35 (ORTHO-NOVUM, NORTREL,CYCLAFEM) tablet Take 1 tablet by mouth daily. 3 Package 3   No current facility-administered medications for this visit.     Family History  Problem Relation Age of Onset  . Multiple births Other     had twins  . Hypertension Maternal Grandmother   . Kidney Stones Maternal Grandfather   . Anemia Mother   . Uterine cancer Mother 20    uterus  . Thyroid disease Paternal Grandmother     ROS:  Pertinent items are noted in HPI.  Otherwise, a comprehensive ROS was negative.  Exam:   BP 120/76 (BP Location: Right Arm, Patient Position: Sitting, Cuff Size: Normal)   Pulse 100   Resp 20   Ht 5' 2.5" (1.588 m)   Wt 181 lb (82.1 kg)   LMP 05/25/2016 (Exact Date)   BMI 32.58 kg/m     General appearance: alert, cooperative and appears stated age Head: Normocephalic, without obvious abnormality, atraumatic Neck: no adenopathy, supple, symmetrical, trachea midline and thyroid normal to inspection and palpation Lungs: clear to auscultation bilaterally Breasts: normal appearance, no masses or tenderness, No nipple retraction or dimpling, No nipple discharge or bleeding, No axillary or supraclavicular adenopathy Heart: regular rate and rhythm Abdomen: soft, non-tender; no masses, no organomegaly Extremities: extremities normal, atraumatic, no cyanosis or edema Skin: Skin color, texture, turgor normal. No rashes or  lesions Lymph nodes: Cervical, supraclavicular, and axillary nodes normal. No abnormal inguinal nodes palpated Neurologic: Grossly normal  Pelvic: External genitalia:  no lesions              Urethra:  normal appearing urethra with no masses, tenderness or lesions              Bartholins and Skenes: normal                 Vagina: normal appearing vagina with normal color and discharge, no lesions              Cervix: no lesions              Pap taken:  No. Bimanual Exam:  Uterus:  Mild fullness posteriorly, contour, position, consistency, mobility, non-tender              Adnexa: no mass, fullness, tenderness              Chaperone was present for exam.  Assessment:   Well woman visit with normal exam. Subserosal uterine fibroid.  Feels stable by exam.   Plan: Mammogram screening discussed. Recommended self breast awareness. Pap and HR HPV as above.  Due next year. Guidelines for Calcium, Vitamin D, regular exercise program including cardiovascular and weight bearing exercise. Follow fibroid uterus clinically.  Refill OCPs for one year.  Does labs with PCP.   Follow up annually and prn.      After visit summary provided.

## 2016-06-02 NOTE — Patient Instructions (Signed)

## 2017-04-20 ENCOUNTER — Telehealth: Payer: Self-pay | Admitting: Obstetrics and Gynecology

## 2017-04-20 NOTE — Telephone Encounter (Signed)
Erroneous encounter

## 2017-05-22 ENCOUNTER — Other Ambulatory Visit: Payer: Self-pay | Admitting: Obstetrics and Gynecology

## 2017-05-22 MED ORDER — NORETHINDRONE-ETH ESTRADIOL 1-35 MG-MCG PO TABS: 1.0000 | ORAL_TABLET | Freq: Every day | ORAL | 0 refills | Status: DC

## 2017-05-22 NOTE — Telephone Encounter (Signed)
Patient called requesting a one month refill on her birth control to be sent to her local pharmacy on file to last until her AEX. She said she normally gets it from mail order but she only needs a months worth now. She'd like a call back once this has been done.

## 2017-05-22 NOTE — Telephone Encounter (Signed)
Please let patient know that I am refilling her OCPs for one month.

## 2017-05-22 NOTE — Telephone Encounter (Signed)
Medication refill request: OCP Last AEX:  06/02/16 BS Next AEX: 06/12/17  Last MMG (if hormonal medication request): n/a Refill authorized: 06/02/16 #3, 3RF. Today, please advise.

## 2017-05-25 NOTE — Telephone Encounter (Signed)
Detailed message left on mobile number per DPR letting patient know refill had been sent in to pharmacy for her. Advised to return call if any additional questions.   Will close encounter.

## 2017-06-12 ENCOUNTER — Other Ambulatory Visit (HOSPITAL_COMMUNITY)
Admission: RE | Admit: 2017-06-12 | Discharge: 2017-06-12 | Disposition: A | Discharge status: Home / Self Care | Payer: BC Managed Care – PPO | Source: Ambulatory Visit | Attending: Obstetrics and Gynecology | Admitting: Obstetrics and Gynecology

## 2017-06-12 ENCOUNTER — Ambulatory Visit: Payer: BC Managed Care – PPO | Admitting: Obstetrics and Gynecology

## 2017-06-12 ENCOUNTER — Encounter: Payer: Self-pay | Admitting: Obstetrics and Gynecology

## 2017-06-12 ENCOUNTER — Other Ambulatory Visit: Payer: Self-pay

## 2017-06-12 VITALS — BP 118/68 | HR 72 | Resp 16 | Ht 62.75 in | Wt 182.0 lb

## 2017-06-12 DIAGNOSIS — Z01419 Encounter for gynecological examination (general) (routine) without abnormal findings: Secondary | ICD-10-CM | POA: Diagnosis not present

## 2017-06-12 DIAGNOSIS — D252 Subserosal leiomyoma of uterus: Secondary | ICD-10-CM

## 2017-06-12 DIAGNOSIS — Z113 Encounter for screening for infections with a predominantly sexual mode of transmission: Secondary | ICD-10-CM | POA: Diagnosis not present

## 2017-06-12 DIAGNOSIS — D259 Leiomyoma of uterus, unspecified: Secondary | ICD-10-CM | POA: Insufficient documentation

## 2017-06-12 MED ORDER — NORETHINDRONE-ETH ESTRADIOL 1-35 MG-MCG PO TABS: 1.0000 | ORAL_TABLET | Freq: Every day | ORAL | 3 refills | Status: DC

## 2017-06-12 NOTE — Patient Instructions (Signed)

## 2017-06-12 NOTE — Progress Notes (Signed)
37 y.o. G0P0 Single Caucasian female here for annual exam. Patient would like STD testing   Taking OCPs. Receiving generic birth control pills through mail and receiving some variation in the generics. When she took a new type for 3 months she had breakthrough bleeding.  No skipped or late periods. Wondering if her fibroids are back. Has a known 5 cm subserosal fibroid.   PCP: [facility] - Marda Stalker, PA-C  Patient's last menstrual period was 05/25/2017.           Sexually active: No. -- Last sexually active April 2018 The current method of family planning is Ortho Novum.    Exercising: No.  occasional walking Smoker:  no  Health Maintenance: Pap:   05-19-14 Neg:neg HR HPV History of abnormal Pap:  no TDaP:  04/19/09 Gardasil:   no HIV and Hep C: 05/30/15 Negative Screening Labs:  Discuss today   reports that she has never smoked. She has never used smokeless tobacco. She reports that she drinks about 1.8 oz of alcohol per week. She reports that she does not use drugs.  Past Medical History:  Diagnosis Date  . Anemia    BORDERLINE  . Basal cell carcinoma of left shoulder 05/2005  . Complication of anesthesia   . Hyperlipidemia   . PONV (postoperative nausea and vomiting)     Past Surgical History:  Procedure Laterality Date  . BASAL CELL CARCINOMA EXCISION  2007   left shoulder  . DILATATION & CURETTAGE/HYSTEROSCOPY WITH MYOSURE N/A 06/26/2015   Procedure: DILATATION & CURETTAGE/HYSTEROSCOPY WITH MYOSURE RESECTION OF SUBMUCOUS FIBROID ;  Surgeon: Nunzio Cobbs, MD;  Location: [facility];  Service: Gynecology;  Laterality: N/A;  fibroid resection  . WISDOM TOOTH EXTRACTION  age 44    Current Outpatient Medications  Medication Sig Dispense Refill  . fexofenadine (ALLEGRA) 180 MG tablet Take 180 mg by mouth as needed for allergies or rhinitis.    . fluticasone (FLONASE) 50 MCG/ACT nasal spray Place 1 spray into both nostrils daily.    .  norethindrone-ethinyl estradiol 1/35 (ORTHO-NOVUM, NORTREL,CYCLAFEM) tablet Take 1 tablet by mouth daily. 3 Package 3   No current facility-administered medications for this visit.     Family History  Problem Relation Age of Onset  . Multiple births Other        had twins  . Hypertension Maternal Grandmother   . Kidney Stones Maternal Grandfather   . Anemia Mother   . Uterine cancer Mother 90       uterus  . Thyroid disease Paternal Grandmother     ROS:  Pertinent items are noted in HPI.  Otherwise, a comprehensive ROS was negative.  Exam:   BP 118/68 (BP Location: Right Arm, Patient Position: Sitting, Cuff Size: Normal)   Pulse 72   Resp 16   Ht 5' 2.75" (1.594 m)   Wt 182 lb (82.6 kg)   LMP 05/25/2017   BMI 32.50 kg/m     General appearance: alert, cooperative and appears stated age Head: Normocephalic, without obvious abnormality, atraumatic Neck: no adenopathy, supple, symmetrical, trachea midline and thyroid normal to inspection and palpation Lungs: clear to auscultation bilaterally Breasts: normal appearance, no masses or tenderness, No nipple retraction or dimpling, No nipple discharge or bleeding, No axillary or supraclavicular adenopathy Heart: regular rate and rhythm Abdomen: soft, non-tender; no masses, no organomegaly Extremities: extremities normal, atraumatic, no cyanosis or edema Skin: Skin color, texture, turgor normal. No rashes or lesions Lymph nodes: Cervical, supraclavicular, and  axillary nodes normal. No abnormal inguinal nodes palpated Neurologic: Grossly normal  Pelvic: External genitalia:  no lesions              Urethra:  normal appearing urethra with no masses, tenderness or lesions              Bartholins and Skenes: normal                 Vagina: normal appearing vagina with normal color and discharge, no lesions              Cervix: no lesions              Pap taken: Yes.   Bimanual Exam:  Uterus:  5 -6 week size, contour, position,  consistency, mobility, non-tender              Adnexa: no mass, fullness, tenderness              Rectal exam: Yes.  .  Confirms.              Anus:  normal sphincter tone, no lesions  Chaperone was present for exam.  Assessment:   Well woman visit with normal exam. Subserosal uterine fibroid.  Feels smaller on exam today.  Desire for STD testing.  Hyperlipidemia.   Plan: Mammogram screening discussed. Recommended self breast awareness. Pap and HR HPV as above. Guidelines for Calcium, Vitamin D, regular exercise program including cardiovascular and weight bearing exercise. Discussed Gardasil vaccine.  She declines.  STD screening.  Refill OCPs for one year.  Sent to local pharmacy per request so she can try to get the same generic. Will do pelvic US/sonohysterogram if irregular menses return. Follow up annually and prn.   After visit summary provided.

## 2017-06-13 LAB — HEP, RPR, HIV PANEL
HEP B S AG: NEGATIVE
HIV SCREEN 4TH GENERATION: NONREACTIVE
RPR: NONREACTIVE

## 2017-06-13 LAB — HEPATITIS C ANTIBODY: Hep C Virus Ab: <0.1 s/co ratio (ref 0.0–0.9)

## 2017-06-15 LAB — CYTOLOGY - PAP
CHLAMYDIA, DNA PROBE: NEGATIVE
DIAGNOSIS: NEGATIVE
HPV: NOT DETECTED
Neisseria Gonorrhea: NEGATIVE
Trichomonas: NEGATIVE

## 2017-09-09 ENCOUNTER — Other Ambulatory Visit: Payer: Self-pay | Admitting: Obstetrics and Gynecology

## 2017-09-09 NOTE — Telephone Encounter (Signed)
Medication refill request: OCP Last AEX:  06-12-17  Next AEX: 07-19-18  Last MMG (if hormonal medication request): N/A Refill authorized: please advise

## 2018-07-19 ENCOUNTER — Ambulatory Visit: Payer: BC Managed Care – PPO | Admitting: Obstetrics and Gynecology

## 2018-07-30 ENCOUNTER — Other Ambulatory Visit: Payer: Self-pay | Admitting: *Deleted

## 2018-07-30 MED ORDER — NORETHINDRONE-ETH ESTRADIOL 1-35 MG-MCG PO TABS: 1.0000 | ORAL_TABLET | Freq: Every day | ORAL | 0 refills | Status: DC

## 2018-07-30 NOTE — Telephone Encounter (Signed)
Medication refill request: Notrel 28/1-0.035 tabs Last AEX:  06/12/17 Dr. Quincy Simmonds  Next AEX: none Last MMG (if hormonal medication request): none Refill authorized: 09/09/17 #84tabs 3R. Today #84./0R?

## 2018-09-14 ENCOUNTER — Other Ambulatory Visit: Payer: Self-pay

## 2018-09-16 ENCOUNTER — Encounter: Payer: Self-pay | Admitting: Obstetrics and Gynecology

## 2018-09-16 ENCOUNTER — Other Ambulatory Visit: Payer: Self-pay

## 2018-09-16 ENCOUNTER — Other Ambulatory Visit (HOSPITAL_COMMUNITY)
Admission: RE | Admit: 2018-09-16 | Discharge: 2018-09-16 | Disposition: A | Discharge status: Home / Self Care | Payer: BC Managed Care – PPO | Source: Ambulatory Visit | Attending: Obstetrics and Gynecology | Admitting: Obstetrics and Gynecology

## 2018-09-16 ENCOUNTER — Ambulatory Visit: Payer: BC Managed Care – PPO | Admitting: Obstetrics and Gynecology

## 2018-09-16 VITALS — BP 118/80 | HR 88 | Temp 97.8°F | Resp 12 | Ht 62.75 in | Wt 189.0 lb

## 2018-09-16 DIAGNOSIS — Z113 Encounter for screening for infections with a predominantly sexual mode of transmission: Secondary | ICD-10-CM | POA: Insufficient documentation

## 2018-09-16 DIAGNOSIS — Z01419 Encounter for gynecological examination (general) (routine) without abnormal findings: Secondary | ICD-10-CM | POA: Diagnosis not present

## 2018-09-16 MED ORDER — NORTREL 1/35 (28) 1-35 MG-MCG PO TABS: 1.0000 | ORAL_TABLET | Freq: Every day | ORAL | 3 refills | Status: DC

## 2018-09-16 NOTE — Patient Instructions (Signed)

## 2018-09-16 NOTE — Progress Notes (Signed)
38 y.o. G0P0 Single Caucasian female here for annual exam.    Doing ok with her birth control pills.  No increased bleeding or pain.  She has a uterine fibroid.  Wants STD testing.   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   PCP: Kathyrn Lass, MD     Patient's last menstrual period was 09/12/2018.           Sexually active: No.  The current method of family planning is Nortrel.    Exercising: Yes.    walking Smoker:  no  Health Maintenance: Pap:  06/12/17 Neg:Neg HR HPV History of abnormal Pap:  no TDaP:  2011 Gardasil:   no HIV and Hep C: 06/12/17 Negative Screening Labs:  Discuss if needed   reports that she has never smoked. She has never used smokeless tobacco. She reports current alcohol use of about 3.0 standard drinks of alcohol per week. She reports that she does not use drugs.  Past Medical History:  Diagnosis Date  . Anemia    BORDERLINE  . Basal cell carcinoma of left shoulder 05/2005  . Complication of anesthesia   . Hyperlipidemia   . PONV (postoperative nausea and vomiting)     Past Surgical History:  Procedure Laterality Date  . BASAL CELL CARCINOMA EXCISION  2007   left shoulder  . DILATATION & CURETTAGE/HYSTEROSCOPY WITH MYOSURE N/A 06/26/2015   Procedure: DILATATION & CURETTAGE/HYSTEROSCOPY WITH MYOSURE RESECTION OF SUBMUCOUS FIBROID ;  Surgeon: Nunzio Cobbs, MD;  Location: [facility];  Service: Gynecology;  Laterality: N/A;  fibroid resection  . WISDOM TOOTH EXTRACTION  age 21    Current Outpatient Medications  Medication Sig Dispense Refill  . fexofenadine (ALLEGRA) 180 MG tablet Take 180 mg by mouth as needed for allergies or rhinitis.    . fluticasone (FLONASE) 50 MCG/ACT nasal spray Place 1 spray into both nostrils daily.    . norethindrone-ethinyl estradiol 1/35 (NORTREL 1/35, 28,) tablet Take 1 tablet by mouth daily. 84 tablet 0   No current facility-administered medications for this visit.    MVI, CoQ 10, Red yeast rice.    Family History  Problem Relation Age of Onset  . Multiple births Other        had twins  . Hypertension Maternal Grandmother   . Kidney Stones Maternal Grandfather   . Anemia Mother   . Uterine cancer Mother 26       uterus  . Thyroid disease Paternal Grandmother     Review of Systems  Constitutional: Negative.   HENT: Negative.   Eyes: Negative.   Respiratory: Negative.   Cardiovascular: Negative.   Gastrointestinal: Negative.   Endocrine: Negative.   Genitourinary: Negative.   Musculoskeletal: Negative.   Skin: Negative.   Allergic/Immunologic: Negative.   Neurological: Negative.   Hematological: Negative.   Psychiatric/Behavioral: Negative.     Exam:   BP 118/80 (BP Location: Right Arm, Patient Position: Sitting, Cuff Size: Large)   Pulse 88   Temp 97.8 F (36.6 C) (Temporal)   Resp 12   Ht 5' 2.75" (1.594 m)   Wt 189 lb (85.7 kg)   LMP 09/12/2018   BMI 33.75 kg/m     General appearance: alert, cooperative and appears stated age Head: normocephalic, without obvious abnormality, atraumatic Neck: no adenopathy, supple, symmetrical, trachea midline and thyroid normal to inspection and palpation Lungs: clear to auscultation bilaterally Breasts: normal appearance, no masses or tenderness, No nipple retraction or dimpling, No nipple discharge or  bleeding, No axillary adenopathy Heart: regular rate and rhythm Abdomen: soft, non-tender; no masses, no organomegaly Extremities: extremities normal, atraumatic, no cyanosis or edema Skin: skin color, texture, turgor normal. No rashes or lesions Lymph nodes: cervical, supraclavicular, and axillary nodes normal. Neurologic: grossly normal  Pelvic: External genitalia:  no lesions              No abnormal inguinal nodes palpated.              Urethra:  normal appearing urethra with no masses, tenderness or lesions              Bartholins and Skenes: normal                 Vagina: normal appearing vagina with normal color  and discharge, no lesions              Cervix: no lesions              Pap taken: No. Bimanual Exam:  Uterus:  6 week size, normal mobility, non-tender              Adnexa: no mass, fullness, tenderness               Chaperone was present for exam.  Assessment:   Well woman visit with normal exam. Subserosal fibroid. Hyperlipidemia.  FH uterine cancer.   Plan: Mammogram screening discussed. Self breast awareness reviewed. Pap and HR HPV as above. Guidelines for Calcium, Vitamin D, regular exercise program including cardiovascular and weight bearing exercise. STD screening She declines Gardasil. Follow up annually and prn.   After visit summary provided.

## 2018-09-18 LAB — HEP, RPR, HIV PANEL
HIV Screen 4th Generation wRfx: NONREACTIVE
Hepatitis B Surface Ag: NEGATIVE
RPR Ser Ql: NONREACTIVE

## 2018-09-18 LAB — HEPATITIS C ANTIBODY: Hep C Virus Ab: <0.1 s/co ratio (ref 0.0–0.9)

## 2018-09-21 LAB — CERVICOVAGINAL ANCILLARY ONLY
Chlamydia: NEGATIVE
Neisseria Gonorrhea: NEGATIVE
Trichomonas: NEGATIVE

## 2019-03-17 ENCOUNTER — Encounter

## 2019-03-18 DIAGNOSIS — C4490 Unspecified malignant neoplasm of skin, unspecified: Secondary | ICD-10-CM

## 2019-03-18 HISTORY — DX: Unspecified malignant neoplasm of skin, unspecified: C44.90

## 2019-03-18 HISTORY — PX: OTHER SURGICAL HISTORY: SHX169

## 2019-09-07 ENCOUNTER — Other Ambulatory Visit: Payer: Self-pay | Admitting: Obstetrics and Gynecology

## 2019-09-07 NOTE — Telephone Encounter (Signed)
Medication refill request: Nortrel Last AEX:  09/16/18 Dr. Quincy Simmonds Next AEX: 10/19/19  Last MMG (if hormonal medication request): n/a Refill authorized: Please advise; order pended #28 w/0 refills if authorized

## 2019-10-16 DIAGNOSIS — U071 COVID-19: Secondary | ICD-10-CM

## 2019-10-16 HISTORY — DX: COVID-19: U07.1

## 2019-10-18 NOTE — Progress Notes (Signed)
39 y.o. G0P0 Single Caucasian female here for annual exam.    Patient states cycles are heavier and more painful. Cycles are not irregular. She is concerned about fibroids.  Desires STD screening.   Visit to [place] and [place].  Will check Covid abys.   PCP:  Marda Stalker, PA-C/Lisa Sabra Heck, MD   Patient's last menstrual period was 10/10/2019 (exact date).     Period Cycle (Days): 30 Period Duration (Days): 4-5 days Period Pattern: Regular Menstrual Flow: Moderate Menstrual Control: Tampon, Maxi pad Menstrual Control Change Freq (Hours): every 3 hours on heaviest day Dysmenorrhea: (!) Severe (using heating pad) Dysmenorrhea Symptoms: Cramping     Sexually active: No.  The current method of family planning is oral combined contraceptive.  Exercising: Yes.    walks 1 hour/day Smoker:  no  Health Maintenance: Pap: 06/12/17 Neg:Neg HR HPV, 05-19-14 Neg:Neg HR HPV History of abnormal Pap:  no MMG:  n/a Colonoscopy:  n/a BMD:   n/a  Result  n/a TDaP:  04-19-09--wants today Gardasil:   no HIV:09-16-18 NR Hep C:09-16-18 Neg Screening Labs:  PCP   reports that she has never smoked. She has never used smokeless tobacco. She reports current alcohol use of about 3.0 standard drinks of alcohol per week. She reports that she does not use drugs.  Past Medical History:  Diagnosis Date  . Anemia    BORDERLINE  . Basal cell carcinoma of left shoulder 05/2005   shoulder  . Complication of anesthesia   . Hyperlipidemia   . PONV (postoperative nausea and vomiting)   . Skin cancer 2021   left upper thigh    Past Surgical History:  Procedure Laterality Date  . BASAL CELL CARCINOMA EXCISION  2007   left shoulder  . DILATATION & CURETTAGE/HYSTEROSCOPY WITH MYOSURE N/A 06/26/2015   Procedure: DILATATION & CURETTAGE/HYSTEROSCOPY WITH MYOSURE RESECTION OF SUBMUCOUS FIBROID ;  Surgeon: Nunzio Cobbs, MD;  Location: [facility];  Service: Gynecology;  Laterality: N/A;  fibroid resection  . skin  cancer excision  2021   upper left thigh  . WISDOM TOOTH EXTRACTION  age 67    Current Outpatient Medications  Medication Sig Dispense Refill  . fexofenadine (ALLEGRA) 180 MG tablet Take 180 mg by mouth as needed for allergies or rhinitis.    . fluticasone (FLONASE) 50 MCG/ACT nasal spray Place 1 spray into both nostrils daily.    Marland Kitchen NORTREL 1/35, 28, tablet TAKE 1 TABLET BY MOUTH EVERY DAY 28 tablet 1   No current facility-administered medications for this visit.    Family History  Problem Relation Age of Onset  . Multiple births Other        had twins  . Hypertension Maternal Grandmother   . Kidney Stones Maternal Grandfather   . Anemia Mother   . Uterine cancer Mother 74       uterus  . Thyroid disease Paternal Grandmother     Review of Systems  All other systems reviewed and are negative.   Exam:   BP 120/78   Pulse 100   Resp 14   Ht 5' 2.5" (1.588 m)   Wt 199 lb (90.3 kg)   LMP 10/10/2019 (Exact Date)   BMI 35.82 kg/m     General appearance: alert, cooperative and appears stated age Head: normocephalic, without obvious abnormality, atraumatic Neck: no adenopathy, supple, symmetrical, trachea midline and thyroid normal to inspection and palpation Lungs: clear to auscultation bilaterally Breasts: normal appearance, no masses or tenderness, No nipple  retraction or dimpling, No nipple discharge or bleeding, No axillary adenopathy Heart: regular rate and rhythm Abdomen: soft, non-tender; no masses, no organomegaly Extremities: extremities normal, atraumatic, no cyanosis or edema Skin: skin color, texture, turgor normal. No rashes or lesions Lymph nodes: cervical, supraclavicular, and axillary nodes normal. Neurologic: grossly normal  Pelvic: External genitalia:  no lesions              No abnormal inguinal nodes palpated.              Urethra:  normal appearing urethra with no masses, tenderness or lesions              Bartholins and Skenes: normal                  Vagina: normal appearing vagina with normal color and discharge, no lesions              Cervix: no lesions              Pap taken: No. Bimanual Exam:  Uterus: irregular texture, non-tender              Adnexa: no mass, fullness, tenderness      Chaperone was present for exam.  Assessment:   Well woman visit with normal exam. Menorrhagia with regular cycles.  Subserosal fibroid. Dysmenorrhea. Hyperlipidemia.  FH uterine cancer.   Plan: Mammogram screening discussed. Self breast awareness reviewed. Pap and HR HPV as above. Guidelines for Calcium, Vitamin D, regular exercise program including cardiovascular and weight bearing exercise. Return for pelvic US.  Refill of COCs for one year.  Labs with PCP.  TDap today. Follow up annually and prn.   After visit summary provided.

## 2019-10-19 ENCOUNTER — Other Ambulatory Visit: Payer: Self-pay

## 2019-10-19 ENCOUNTER — Encounter: Payer: Self-pay | Admitting: Obstetrics and Gynecology

## 2019-10-19 ENCOUNTER — Other Ambulatory Visit (HOSPITAL_COMMUNITY)
Admission: RE | Admit: 2019-10-19 | Discharge: 2019-10-19 | Disposition: A | Discharge status: Home / Self Care | Payer: BC Managed Care – PPO | Source: Ambulatory Visit | Attending: Obstetrics and Gynecology | Admitting: Obstetrics and Gynecology

## 2019-10-19 ENCOUNTER — Telehealth: Payer: Self-pay | Admitting: Obstetrics and Gynecology

## 2019-10-19 ENCOUNTER — Ambulatory Visit: Payer: BC Managed Care – PPO | Admitting: Obstetrics and Gynecology

## 2019-10-19 VITALS — BP 120/78 | HR 100 | Resp 14 | Ht 62.5 in | Wt 199.0 lb

## 2019-10-19 DIAGNOSIS — N946 Dysmenorrhea, unspecified: Secondary | ICD-10-CM

## 2019-10-19 DIAGNOSIS — Z01419 Encounter for gynecological examination (general) (routine) without abnormal findings: Secondary | ICD-10-CM

## 2019-10-19 DIAGNOSIS — N920 Excessive and frequent menstruation with regular cycle: Secondary | ICD-10-CM | POA: Diagnosis not present

## 2019-10-19 DIAGNOSIS — Z23 Encounter for immunization: Secondary | ICD-10-CM

## 2019-10-19 DIAGNOSIS — Z113 Encounter for screening for infections with a predominantly sexual mode of transmission: Secondary | ICD-10-CM | POA: Insufficient documentation

## 2019-10-19 MED ORDER — NORTREL 1/35 (28) 1-35 MG-MCG PO TABS: 1.0000 | ORAL_TABLET | Freq: Every day | ORAL | 3 refills | Status: DC

## 2019-10-19 NOTE — Telephone Encounter (Signed)
Spoke with patient regarding benefits for recommended ultrasound. Patient is aware that ultrasound is transvaginal. Patient acknowledges understanding of information presented. Patient is aware of cancellation policy. Patient scheduled appointment for 10/25/2019 at 0300PM with Brook A. Quincy Simmonds, MD, Cherlynn June. Encounter closed.

## 2019-10-19 NOTE — Addendum Note (Signed)
Addended by: Lowella Fairy on: 10/19/2019 09:20 AM   Modules accepted: Orders

## 2019-10-19 NOTE — Patient Instructions (Signed)

## 2019-10-20 LAB — HEP, RPR, HIV PANEL
HIV Screen 4th Generation wRfx: NONREACTIVE
Hepatitis B Surface Ag: NEGATIVE
RPR Ser Ql: NONREACTIVE

## 2019-10-20 LAB — CERVICOVAGINAL ANCILLARY ONLY
Chlamydia: NEGATIVE
Comment: NEGATIVE
Comment: NEGATIVE
Comment: NORMAL
Neisseria Gonorrhea: NEGATIVE
Trichomonas: NEGATIVE

## 2019-10-20 LAB — HEPATITIS C ANTIBODY: Hep C Virus Ab: <0.1 s/co ratio (ref 0.0–0.9)

## 2019-10-25 ENCOUNTER — Ambulatory Visit: Payer: BC Managed Care – PPO | Admitting: Obstetrics and Gynecology

## 2019-10-25 ENCOUNTER — Other Ambulatory Visit: Payer: Self-pay

## 2019-10-25 ENCOUNTER — Encounter: Payer: Self-pay | Admitting: Obstetrics and Gynecology

## 2019-10-25 ENCOUNTER — Ambulatory Visit (INDEPENDENT_AMBULATORY_CARE_PROVIDER_SITE_OTHER): Payer: BC Managed Care – PPO

## 2019-10-25 VITALS — BP 114/74 | HR 90 | Ht 62.5 in | Wt 199.0 lb

## 2019-10-25 DIAGNOSIS — N946 Dysmenorrhea, unspecified: Secondary | ICD-10-CM

## 2019-10-25 DIAGNOSIS — N920 Excessive and frequent menstruation with regular cycle: Secondary | ICD-10-CM | POA: Diagnosis not present

## 2019-10-25 DIAGNOSIS — D219 Benign neoplasm of connective and other soft tissue, unspecified: Secondary | ICD-10-CM | POA: Diagnosis not present

## 2019-10-25 NOTE — Progress Notes (Signed)
GYNECOLOGY  VISIT   HPI: 39 y.o.   Single  Caucasian  female   G0P0 with Patient's last menstrual period was 10/10/2019 (exact date).   here for pelvic ultrasound for heavy and painful cycles.  She has a history of uterine fibroid removed hysteroscopically.   Her pain is her bigger concern.   She is on COCs.  GYNECOLOGIC HISTORY: Patient's last menstrual period was 10/10/2019 (exact date). Contraception: COCs Menopausal hormone therapy:  none Last mammogram: n/a Last pap smear:   06/12/17 Neg:Neg HR HPV, 05-19-14 Neg:Neg HR HPV        OB History    Gravida  0   Para  0   Term      Preterm      AB      Living        SAB      TAB      Ectopic      Multiple      Live Births                 Patient Active Problem List   Diagnosis Date Noted   Uterine fibroid 06/12/2017    Past Medical History:  Diagnosis Date   Anemia    BORDERLINE   Basal cell carcinoma of left shoulder 05/2005   shoulder   Complication of anesthesia    Hyperlipidemia    PONV (postoperative nausea and vomiting)    Skin cancer 2021   left upper thigh    Past Surgical History:  Procedure Laterality Date   BASAL CELL CARCINOMA EXCISION  2007   left shoulder   DILATATION & CURETTAGE/HYSTEROSCOPY WITH MYOSURE N/A 06/26/2015   Procedure: DILATATION & CURETTAGE/HYSTEROSCOPY WITH MYOSURE RESECTION OF SUBMUCOUS FIBROID ;  Surgeon: Nunzio Cobbs, MD;  Location: [facility];  Service: Gynecology;  Laterality: N/A;  fibroid resection   skin cancer excision  2021   upper left thigh   WISDOM TOOTH EXTRACTION  age 27    Current Outpatient Medications  Medication Sig Dispense Refill   fexofenadine (ALLEGRA) 180 MG tablet Take 180 mg by mouth as needed for allergies or rhinitis.     fluticasone (FLONASE) 50 MCG/ACT nasal spray Place 1 spray into both nostrils daily.     norethindrone-ethinyl estradiol 1/35 (NORTREL 1/35, 28,) tablet Take 1 tablet by mouth daily. 84 tablet 3    No current facility-administered medications for this visit.     ALLERGIES: Azithromycin  Family History  Problem Relation Age of Onset   Multiple births Other        had twins   Hypertension Maternal Grandmother    Kidney Stones Maternal Grandfather    Anemia Mother    Uterine cancer Mother 67       uterus   Thyroid disease Paternal Grandmother     Social History   Socioeconomic History   Marital status: Single    Spouse name: Not on file   Number of children: 0   Years of education: Not on file   Highest education level: Not on file  Occupational History   Not on file  Tobacco Use   Smoking status: Never Smoker   Smokeless tobacco: Never Used  Vaping Use   Vaping Use: Never used  Substance and Sexual Activity   Alcohol use: Yes    Alcohol/week: 3.0 standard drinks    Types: 3 Glasses of wine per week    Comment: 1-3 GLASSES PER WEEK   Drug use: No  Sexual activity: Not Currently    Partners: Male    Birth control/protection: Pill    Comment: Necon 1/35  Other Topics Concern   Not on file  Social History Narrative   Not on file   Social Determinants of Health   Financial Resource Strain:    Difficulty of Paying Living Expenses:   Food Insecurity:    Worried About Charity fundraiser in the Last Year:    Arboriculturist in the Last Year:   Transportation Needs:    Film/video editor (Medical):    Lack of Transportation (Non-Medical):   Physical Activity:    Days of Exercise per Week:    Minutes of Exercise per Session:   Stress:    Feeling of Stress :   Social Connections:    Frequency of Communication with Friends and Family:    Frequency of Social Gatherings with Friends and Family:    Attends Religious Services:    Active Member of Clubs or Organizations:    Attends Music therapist:    Marital Status:   Intimate Partner Violence:    Fear of Current or Ex-Partner:    Emotionally Abused:     Physically Abused:    Sexually Abused:     Review of Systems  All other systems reviewed and are negative.   PHYSICAL EXAMINATION:    BP 114/74 (Cuff Size: Large)    Pulse 90    Ht 5' 2.5" (1.588 m)    Wt 199 lb (90.3 kg)    LMP 10/10/2019 (Exact Date)    BMI 35.82 kg/m     General appearance: alert, cooperative and appears stated age   Pelvic US Uterus with multiple fibroids, increased in size and number.  Largest is 7 cm. EMS 2.72 mm, distorted by fibroids. No intracavitary masses. Ovaries normal.  No free fluid.  ASSESSMENT  Fibroids.  Dysmenorrhea.   PLAN  Ultrasound images and report discussed.  Treatment options reviewed -  COCs, Depo Provera, Lupron, Elagolix. Myomectomy, hysterectomy, [place].  She will do COCs for 3 months at a time.  She will call for continued pain or heavy bleeding. FU prn.

## 2019-10-25 NOTE — Progress Notes (Signed)
Encounter reviewed by Dr. Roniyah Llorens Amundson C. Silva.  

## 2020-03-05 ENCOUNTER — Telehealth: Payer: Self-pay

## 2020-03-05 DIAGNOSIS — D219 Benign neoplasm of connective and other soft tissue, unspecified: Secondary | ICD-10-CM

## 2020-03-05 NOTE — Telephone Encounter (Signed)
Patient would like to continue with referral for surgery.

## 2020-03-05 NOTE — Telephone Encounter (Signed)
Left message for pt to return call to triage RN. 

## 2020-03-05 NOTE — Telephone Encounter (Signed)
AEX 10/19/19 OV 10/25/19- dx fibroids   Spoke with pt. Pt states calling today to have referral placed to have fibroids removed and preserve fertility. Pt states since taking OCPs continuous for 3 months and having a cycle every 3 months has helped symptoms, but had " heaviest cycle ever" with last one on LMP 02/05/20. Pt states bleeding was heavy at night and bled through sheets to mattress. Pt denies any heavy bleeding during the day and does not remember how often she changed pad or tampon. Denies feeling weak, dizzy or lightheaded. Denies pain.   Pt advised will review with Dr Quincy Simmonds and return call with recommendations.   Ok for pt to have referral to Dr Kerin Perna for myomectomy to preserve fertility?  Routing to Dr Quincy Simmonds, please advise   PLAN from 10/25/19 PUS:   Ultrasound images and report discussed.  Treatment options reviewed -  COCs, Depo Provera, Lupron, Elagolix. Myomectomy, hysterectomy, [place].  She will do COCs for 3 months at a time.  She will call for continued pain or heavy bleeding. FU prn.

## 2020-03-06 ENCOUNTER — Telehealth: Payer: Self-pay

## 2020-03-06 NOTE — Telephone Encounter (Signed)
Referral form faxed to [facility].  Encounter closed

## 2020-03-06 NOTE — Telephone Encounter (Signed)
Spoke with pt.pt calling to give update to Dr Quincy Simmonds on referral date. Pt has consult appt with Dr Kerin Perna on 07/02/20 as first available appt. Pt is on cancellation list. Pt states would wait to be seen by him since closer to home in [place].  Routing to Dr Quincy Simmonds for update and review  Cc: Hayley for referral update  Encounter closed

## 2020-03-06 NOTE — Telephone Encounter (Signed)
Patient is wanting to know if there are other referral options other than [facility] .

## 2020-03-06 NOTE — Telephone Encounter (Signed)
[place] for referral to Dr. Kerin Perna for fibroids and potential laparoscopic myomectomy.  Does she need an office visit in the meantime?

## 2020-03-06 NOTE — Telephone Encounter (Signed)
Spoke with pt. Pt given update and referral per Dr Quincy Simmonds. Pt verbalized understanding and agreeable to referral.  Referral placed Cc: Hayley for referral  Pt declines office visit at this time. Pt advised if has further questions or concerns in the meantime, then advised to return call to have OV with Dr Quincy Simmonds. Pt agreeable.  Routing to Dr Quincy Simmonds for review and update  Encounter closed

## 2020-09-15 ENCOUNTER — Other Ambulatory Visit: Payer: Self-pay | Admitting: Obstetrics and Gynecology

## 2020-09-18 NOTE — Telephone Encounter (Signed)
Annual exam scheduled on 10/23/20

## 2020-09-26 ENCOUNTER — Other Ambulatory Visit: Payer: Self-pay | Admitting: Obstetrics and Gynecology

## 2020-10-03 ENCOUNTER — Encounter (HOSPITAL_BASED_OUTPATIENT_CLINIC_OR_DEPARTMENT_OTHER): Payer: Self-pay | Admitting: Obstetrics and Gynecology

## 2020-10-04 ENCOUNTER — Encounter (HOSPITAL_BASED_OUTPATIENT_CLINIC_OR_DEPARTMENT_OTHER): Payer: Self-pay | Admitting: Obstetrics and Gynecology

## 2020-10-04 ENCOUNTER — Other Ambulatory Visit: Payer: Self-pay

## 2020-10-04 DIAGNOSIS — D219 Benign neoplasm of connective and other soft tissue, unspecified: Secondary | ICD-10-CM

## 2020-10-04 HISTORY — DX: Benign neoplasm of connective and other soft tissue, unspecified: D21.9

## 2020-10-04 NOTE — Progress Notes (Signed)
Spoke w/ via phone for pre-op interview---pt Lab needs dos---- t &s, urine poct             Lab results------none COVID test -----patient states asymptomatic no test needed Arrive at -------10-10-2020 1000 am NPO after MN NO Solid Food.  Clear liquids from MN until---900 am then npo Med rec completed Medications to take morning of surgery -----none Diabetic medication -----n/a Patient instructed no nail polish to be worn day of surgery Patient instructed to bring photo id and insurance card day of surgery Patient aware to have Driver (ride ) / caregiver   mother Mariah Nichols will stay  for 24 hours after surgery  Patient Special Instructions -----none Pre-Op special Istructions -----surgery orders need 2nd sign Patient verbalized understanding of instructions that were given at this phone interview. Patient denies shortness of breath, chest pain, fever, cough at this phone interview.

## 2020-10-05 HISTORY — PX: OTHER SURGICAL HISTORY: SHX169

## 2020-10-08 NOTE — Progress Notes (Signed)
Pre Procedure note for inpatients:   Mariah Nichols has been scheduled for Procedure(s): LAPAROSCOPIC GELPORT ASSISTED MYOMECTOMY (N/A) Laparoscopic Ablation of Uterine Fibroids with Acessa (N/A) today. The various methods of treatment have been discussed with the patient. After consideration of the risks, benefits and treatment options the patient has consented to the planned procedure.   The patient has been seen and labs reviewed. There are no changes in the patient's condition to prevent proceeding with the planned procedure today.   HPI: 40 year old with fibroids scheduled for Acessa vs L/S Gel port Myomectomy for surgical treatment of myomas at [facility] on October 10, 2020. She stopped Oriahnn on 10/04/20.       PMH: fibroids, hypercholestermia, menorrhagia, skin cancer  PSH: Mohs surgery to various areas of skin, wisdom teeth extraction and H/S myomectomy (2017/2018)     SOCIAL HX: Denies tobacco, alcohol, or drug use.  FAMILY HX: Noncontributory     MEDICATIONS: Womans MVI, Fish oil, CoQ10, Red yeast rice, Tumeric       ALLERGIES: NKDA  PHYSICAL EXAM: Constitutional: Well-developed, well-nourished female in no acute distress. Neurological: Alert and oriented to person, place, and time. Psychiatric: Mood and affect appropriate. Skin: No rashes or lesions. Respiratory: Good air movement with normal work of breathing. Cardiovascular: Regular rate and rhythm. Extremities grossly normal Gastrointestinal: Soft, nontender, nondistended. Appropriate bowel sounds. No masses or hernias appreciated. No hepatosplenomegaly.  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        MANAGEMENT/COUNSULING: * Pre-op counseling:  Discussed risks, benefits, and alternatives to the procedure.  Discussed risk of injury to surrounding organs, bleeding, infection, and creation of scar tissue.   Patient accepts risks and a consent will be signed on the day of surgery. **Bowel prep informational sheet given and reviewed *Pt aware that if Gel-port myomectomy performed, future pregnancies will need to be delivered via C/Section   Recent labs:  Lab Results  Component Value Date   WBC 10.3 06/26/2015   HGB 12.1 06/26/2015   HCT 36.3 06/26/2015   PLT 339 06/26/2015   GLUCOSE 81 05/30/2015   CHOL 237 (H) 05/30/2015   TRIG 225 (H) 05/30/2015   HDL 66 05/30/2015   LDLCALC 126 05/30/2015   ALT 7 05/30/2015   AST 11 05/30/2015   NA 134 (L) 05/30/2015   K 4.2 05/30/2015   CL 99 05/30/2015   CREATININE 0.74 05/30/2015   BUN 17 05/30/2015   CO2 26 05/30/2015   TSH 1.95 05/30/2015    Bertis Ruddy, NP 10/08/2020 12:29 PM

## 2020-10-10 ENCOUNTER — Encounter (HOSPITAL_BASED_OUTPATIENT_CLINIC_OR_DEPARTMENT_OTHER)
Admission: RE | Disposition: A | Discharge status: Home / Self Care | Payer: Self-pay | Source: Home / Self Care | Attending: Obstetrics and Gynecology

## 2020-10-10 ENCOUNTER — Other Ambulatory Visit: Payer: Self-pay

## 2020-10-10 ENCOUNTER — Ambulatory Visit (HOSPITAL_BASED_OUTPATIENT_CLINIC_OR_DEPARTMENT_OTHER): Payer: BC Managed Care – PPO | Admitting: Certified Registered Nurse Anesthetist

## 2020-10-10 ENCOUNTER — Encounter (HOSPITAL_BASED_OUTPATIENT_CLINIC_OR_DEPARTMENT_OTHER): Payer: Self-pay | Admitting: Obstetrics and Gynecology

## 2020-10-10 ENCOUNTER — Ambulatory Visit (HOSPITAL_BASED_OUTPATIENT_CLINIC_OR_DEPARTMENT_OTHER)
Admission: RE | Admit: 2020-10-10 | Discharge: 2020-10-10 | Disposition: A | Discharge status: Home / Self Care | Payer: BC Managed Care – PPO | Attending: Obstetrics and Gynecology | Admitting: Obstetrics and Gynecology

## 2020-10-10 DIAGNOSIS — Z8049 Family history of malignant neoplasm of other genital organs: Secondary | ICD-10-CM | POA: Insufficient documentation

## 2020-10-10 DIAGNOSIS — D252 Subserosal leiomyoma of uterus: Secondary | ICD-10-CM | POA: Diagnosis not present

## 2020-10-10 DIAGNOSIS — Z8249 Family history of ischemic heart disease and other diseases of the circulatory system: Secondary | ICD-10-CM | POA: Diagnosis not present

## 2020-10-10 DIAGNOSIS — Z881 Allergy status to other antibiotic agents status: Secondary | ICD-10-CM | POA: Diagnosis not present

## 2020-10-10 DIAGNOSIS — N920 Excessive and frequent menstruation with regular cycle: Secondary | ICD-10-CM | POA: Diagnosis present

## 2020-10-10 DIAGNOSIS — D259 Leiomyoma of uterus, unspecified: Secondary | ICD-10-CM | POA: Diagnosis not present

## 2020-10-10 DIAGNOSIS — D251 Intramural leiomyoma of uterus: Secondary | ICD-10-CM | POA: Insufficient documentation

## 2020-10-10 DIAGNOSIS — Z8616 Personal history of COVID-19: Secondary | ICD-10-CM | POA: Diagnosis not present

## 2020-10-10 DIAGNOSIS — Z8349 Family history of other endocrine, nutritional and metabolic diseases: Secondary | ICD-10-CM | POA: Diagnosis not present

## 2020-10-10 LAB — POCT PREGNANCY, URINE: Preg Test, Ur: NEGATIVE

## 2020-10-10 LAB — TYPE AND SCREEN
ABO/RH(D): O POS
Antibody Screen: NEGATIVE

## 2020-10-10 SURGERY — Laparoscopic Ablation of Uterine Fibroids with Acessa
Anesthesia: General

## 2020-10-10 MED ORDER — ONDANSETRON HCL 4 MG PO TABS: 4.0000 mg | ORAL_TABLET | Freq: Every day | ORAL | 1 refills | Status: DC | PRN

## 2020-10-10 MED ORDER — ONDANSETRON HCL 4 MG/2ML IJ SOLN
INTRAMUSCULAR | Status: DC | PRN
  Administered 2020-10-10: 4 mg via INTRAVENOUS

## 2020-10-10 MED ORDER — HYDROMORPHONE HCL 1 MG/ML IJ SOLN
INTRAMUSCULAR | Status: DC | PRN
  Administered 2020-10-10: 1 mg via INTRAVENOUS

## 2020-10-10 MED ORDER — CEFAZOLIN SODIUM-DEXTROSE 2-3 GM-%(50ML) IV SOLR
INTRAVENOUS | Status: DC | PRN
  Administered 2020-10-10: 2 g via INTRAVENOUS

## 2020-10-10 MED ORDER — 0.9 % SODIUM CHLORIDE (POUR BTL) OPTIME
TOPICAL | Status: DC | PRN
  Administered 2020-10-10: 500 mL

## 2020-10-10 MED ORDER — MEPERIDINE HCL 25 MG/ML IJ SOLN: 6.2500 mg | INTRAMUSCULAR | Status: DC | PRN

## 2020-10-10 MED ORDER — SCOPOLAMINE 1 MG/3DAYS TD PT72
1.0000 | MEDICATED_PATCH | TRANSDERMAL | Status: DC
  Administered 2020-10-10: 1.5 mg via TRANSDERMAL

## 2020-10-10 MED ORDER — FENTANYL CITRATE (PF) 250 MCG/5ML IJ SOLN
INTRAMUSCULAR | Status: AC
  Filled 2020-10-10: qty 5

## 2020-10-10 MED ORDER — SUGAMMADEX SODIUM 200 MG/2ML IV SOLN
INTRAVENOUS | Status: DC | PRN
  Administered 2020-10-10: 200 mg via INTRAVENOUS

## 2020-10-10 MED ORDER — MIDAZOLAM HCL 5 MG/5ML IJ SOLN
INTRAMUSCULAR | Status: DC | PRN
  Administered 2020-10-10: 2 mg via INTRAVENOUS

## 2020-10-10 MED ORDER — BUPIVACAINE-EPINEPHRINE 0.5% -1:200000 IJ SOLN
INTRAMUSCULAR | Status: DC | PRN
  Administered 2020-10-10 (×2): 10 mL

## 2020-10-10 MED ORDER — FENTANYL CITRATE (PF) 250 MCG/5ML IJ SOLN
INTRAMUSCULAR | Status: DC | PRN
  Administered 2020-10-10 (×2): 50 ug via INTRAVENOUS
  Administered 2020-10-10: 100 ug via INTRAVENOUS
  Administered 2020-10-10 (×2): 50 ug via INTRAVENOUS

## 2020-10-10 MED ORDER — KETOROLAC TROMETHAMINE 30 MG/ML IJ SOLN
INTRAMUSCULAR | Status: DC | PRN
  Administered 2020-10-10: 30 mg via INTRAVENOUS

## 2020-10-10 MED ORDER — DEXAMETHASONE SODIUM PHOSPHATE 10 MG/ML IJ SOLN
INTRAMUSCULAR | Status: AC
  Filled 2020-10-10: qty 1

## 2020-10-10 MED ORDER — SCOPOLAMINE 1 MG/3DAYS TD PT72
MEDICATED_PATCH | TRANSDERMAL | Status: AC
  Filled 2020-10-10: qty 1

## 2020-10-10 MED ORDER — OXYCODONE-ACETAMINOPHEN 7.5-325 MG PO TABS: 1.0000 | ORAL_TABLET | ORAL | 0 refills | Status: DC | PRN

## 2020-10-10 MED ORDER — PROPOFOL 10 MG/ML IV BOLUS
INTRAVENOUS | Status: DC | PRN
Start: 2020-10-10 — End: 2020-10-10
  Administered 2020-10-10: 160 mg via INTRAVENOUS

## 2020-10-10 MED ORDER — FENTANYL CITRATE (PF) 100 MCG/2ML IJ SOLN
INTRAMUSCULAR | Status: AC
  Filled 2020-10-10: qty 2

## 2020-10-10 MED ORDER — ONDANSETRON HCL 4 MG/2ML IJ SOLN
INTRAMUSCULAR | Status: AC
  Filled 2020-10-10: qty 2

## 2020-10-10 MED ORDER — ACETAMINOPHEN 160 MG/5ML PO SOLN: 325.0000 mg | ORAL | Status: DC | PRN

## 2020-10-10 MED ORDER — FENTANYL CITRATE (PF) 100 MCG/2ML IJ SOLN: 25.0000 ug | INTRAMUSCULAR | Status: DC | PRN

## 2020-10-10 MED ORDER — DEXAMETHASONE SODIUM PHOSPHATE 10 MG/ML IJ SOLN
INTRAMUSCULAR | Status: DC | PRN
  Administered 2020-10-10: 10 mg via INTRAVENOUS

## 2020-10-10 MED ORDER — MIDAZOLAM HCL 2 MG/2ML IJ SOLN
INTRAMUSCULAR | Status: AC
  Filled 2020-10-10: qty 2

## 2020-10-10 MED ORDER — ROCURONIUM BROMIDE 10 MG/ML (PF) SYRINGE
PREFILLED_SYRINGE | INTRAVENOUS | Status: DC | PRN
  Administered 2020-10-10: 70 mg via INTRAVENOUS
  Administered 2020-10-10: 20 mg via INTRAVENOUS
  Administered 2020-10-10: 30 mg via INTRAVENOUS

## 2020-10-10 MED ORDER — HYDROMORPHONE HCL 2 MG/ML IJ SOLN
INTRAMUSCULAR | Status: AC
  Filled 2020-10-10: qty 1

## 2020-10-10 MED ORDER — OXYCODONE HCL 5 MG PO TABS: 5.0000 mg | ORAL_TABLET | Freq: Once | ORAL | Status: DC | PRN

## 2020-10-10 MED ORDER — CEFAZOLIN SODIUM 1 G IJ SOLR
INTRAMUSCULAR | Status: AC
  Filled 2020-10-10: qty 20

## 2020-10-10 MED ORDER — LACTATED RINGERS IV SOLN: INTRAVENOUS | Status: DC

## 2020-10-10 MED ORDER — PHENYLEPHRINE 40 MCG/ML (10ML) SYRINGE FOR IV PUSH (FOR BLOOD PRESSURE SUPPORT)
PREFILLED_SYRINGE | INTRAVENOUS | Status: DC | PRN
  Administered 2020-10-10 (×2): 80 ug via INTRAVENOUS

## 2020-10-10 MED ORDER — OXYCODONE HCL 5 MG/5ML PO SOLN: 5.0000 mg | Freq: Once | ORAL | Status: DC | PRN

## 2020-10-10 MED ORDER — ACETAMINOPHEN 325 MG PO TABS: 325.0000 mg | ORAL_TABLET | ORAL | Status: DC | PRN

## 2020-10-10 MED ORDER — ONDANSETRON HCL 4 MG/2ML IJ SOLN
4.0000 mg | Freq: Once | INTRAMUSCULAR | Status: AC | PRN
  Administered 2020-10-10: 4 mg via INTRAVENOUS

## 2020-10-10 MED ORDER — LIDOCAINE 2% (20 MG/ML) 5 ML SYRINGE
INTRAMUSCULAR | Status: DC | PRN
  Administered 2020-10-10: 80 mg via INTRAVENOUS

## 2020-10-10 MED ORDER — KETOROLAC TROMETHAMINE 30 MG/ML IJ SOLN
INTRAMUSCULAR | Status: AC
  Filled 2020-10-10: qty 1

## 2020-10-10 MED ORDER — PROPOFOL 10 MG/ML IV BOLUS
INTRAVENOUS | Status: AC
  Filled 2020-10-10: qty 20

## 2020-10-10 SURGICAL SUPPLY — 67 items
ADH SKN CLS APL DERMABOND .7 (GAUZE/BANDAGES/DRESSINGS) ×1
BAG RETRIEVAL 10 (BASKET)
BLADE EXTENDED COATED 6.5IN (ELECTRODE) IMPLANT
BLADE HEX COATED 2.75 (ELECTRODE) ×2 IMPLANT
BLADE SURG 10 STRL SS (BLADE) ×2 IMPLANT
BRR ADH 6X5 SEPRAFILM 1 SHT (MISCELLANEOUS) ×2
CABLE HIGH FREQUENCY MONO STRZ (ELECTRODE) ×2 IMPLANT
CLEANER CAUTERY TIP 5X5 PAD (MISCELLANEOUS) ×1 IMPLANT
COVER MAYO STAND STRL (DRAPES) ×2 IMPLANT
DERMABOND ADVANCED (GAUZE/BANDAGES/DRESSINGS) ×1
DERMABOND ADVANCED .7 DNX12 (GAUZE/BANDAGES/DRESSINGS) ×1 IMPLANT
DRSG OPSITE POSTOP 3X4 (GAUZE/BANDAGES/DRESSINGS) IMPLANT
DRSG TEGADERM 4X4.75 (GAUZE/BANDAGES/DRESSINGS) IMPLANT
DRSG TELFA 3X8 NADH (GAUZE/BANDAGES/DRESSINGS) ×2 IMPLANT
DURAPREP 26ML APPLICATOR (WOUND CARE) ×2 IMPLANT
ELECT NEEDLE TIP 2.8 STRL (NEEDLE) IMPLANT
ELECT REM PT RETURN 9FT ADLT (ELECTROSURGICAL) ×2
ELECTRODE REM PT RTRN 9FT ADLT (ELECTROSURGICAL) ×1 IMPLANT
GAUZE 4X4 16PLY ~~LOC~~+RFID DBL (SPONGE) ×4 IMPLANT
GLOVE SURG ENC MOIS LTX SZ8 (GLOVE) ×4 IMPLANT
GOWN STRL REUS W/TWL XL LVL3 (GOWN DISPOSABLE) ×4 IMPLANT
HOLDER FOLEY CATH W/STRAP (MISCELLANEOUS) IMPLANT
KIT TURNOVER CYSTO (KITS) ×2 IMPLANT
MANIPULATOR UTERINE 4.5 ZUMI (MISCELLANEOUS) ×2 IMPLANT
NDL SAFETY ECLIPSE 18X1.5 (NEEDLE) ×1 IMPLANT
NEEDLE HYPO 18GX1.5 SHARP (NEEDLE) ×2
NEEDLE HYPO 22GX1.5 SAFETY (NEEDLE) ×4 IMPLANT
NEEDLE INSUFFLATION 120MM (ENDOMECHANICALS) ×2 IMPLANT
NS IRRIG 500ML POUR BTL (IV SOLUTION) ×2 IMPLANT
PACK LAPAROSCOPY BASIN (CUSTOM PROCEDURE TRAY) ×2 IMPLANT
PACK TRENDGUARD 450 HYBRID PRO (MISCELLANEOUS) ×1 IMPLANT
PAD CLEANER CAUTERY TIP 5X5 (MISCELLANEOUS) ×1
PAD GROUNDING ACESSA PRO VU (MISCELLANEOUS) ×2 IMPLANT
PAD OB MATERNITY 4.3X12.25 (PERSONAL CARE ITEMS) ×2 IMPLANT
PENCIL SMOKE EVACUATOR (MISCELLANEOUS) ×2 IMPLANT
SEPRAFILM MEMBRANE 5X6 (MISCELLANEOUS) ×4 IMPLANT
SET HANDPIECE ACESSA GROUNDING (MISCELLANEOUS) ×2 IMPLANT
SET SUCTION IRRIG HYDROSURG (IRRIGATION / IRRIGATOR) ×2 IMPLANT
SET TRI-LUMEN FLTR TB AIRSEAL (TUBING) IMPLANT
SET TUBE SMOKE EVAC HIGH FLOW (TUBING) IMPLANT
SHEARS HARMONIC ACE PLUS 36CM (ENDOMECHANICALS) IMPLANT
SPONGE T-LAP 18X18 ~~LOC~~+RFID (SPONGE) IMPLANT
SPONGE T-LAP 4X18 ~~LOC~~+RFID (SPONGE) ×2 IMPLANT
STOPCOCK 4 WAY LG BORE MALE ST (IV SETS) IMPLANT
SUT MNCRL AB 4-0 PS2 18 (SUTURE) ×2 IMPLANT
SUT VIC AB 0 CT1 36 (SUTURE) IMPLANT
SUT VIC AB 2-0 CT1 (SUTURE) ×4 IMPLANT
SUT VIC AB 2-0 CT2 27 (SUTURE) IMPLANT
SUT VIC AB 2-0 UR6 27 (SUTURE) IMPLANT
SUT VIC AB 4-0 SH 27 (SUTURE) ×4
SUT VIC AB 4-0 SH 27XANBCTRL (SUTURE) ×2 IMPLANT
SYR 30ML LL (SYRINGE) ×2 IMPLANT
SYR 50ML LL SCALE MARK (SYRINGE) ×2 IMPLANT
SYR 5ML LL (SYRINGE) ×2 IMPLANT
SYR CONTROL 10ML LL (SYRINGE) ×4 IMPLANT
SYS BAG RETRIEVAL 10MM (BASKET)
SYS LAPSCP GELPORT 120MM (MISCELLANEOUS)
SYSTEM BAG RETRIEVAL 10MM (BASKET) IMPLANT
SYSTEM LAPSCP GELPORT 120MM (MISCELLANEOUS) IMPLANT
TOWEL OR 17X26 10 PK STRL BLUE (TOWEL DISPOSABLE) ×2 IMPLANT
TRAY FOLEY W/BAG SLVR 14FR LF (SET/KITS/TRAYS/PACK) ×2 IMPLANT
TRENDGUARD 450 HYBRID PRO PACK (MISCELLANEOUS) ×2
TROCAR OPTI TIP 5M 100M (ENDOMECHANICALS) ×4 IMPLANT
TROCAR PORT AIRSEAL 5X120 (TROCAR) IMPLANT
TROCAR XCEL DIL TIP R 11M (ENDOMECHANICALS) ×2 IMPLANT
WARMER LAPAROSCOPE (MISCELLANEOUS) ×2 IMPLANT
WATER STERILE IRR 500ML POUR (IV SOLUTION) IMPLANT

## 2020-10-10 NOTE — Discharge Instructions (Addendum)
Post Anesthesia Home Care Instructions  Activity: Get plenty of rest for the remainder of the day. A responsible adult should stay with you for 24 hours following the procedure.  For the next 24 hours, DO NOT: -Drive a car -Paediatric nurse -Drink alcoholic beverages -Take any medication unless instructed by your physician -Make any legal decisions or sign important papers.  Meals: Start with liquid foods such as gelatin or soup. Progress to regular foods as tolerated. Avoid greasy, spicy, heavy foods. If nausea and/or vomiting occur, drink only clear liquids until the nausea and/or vomiting subsides. Call your physician if vomiting continues.  Special Instructions/Symptoms: Your throat may feel dry or sore from the anesthesia or the breathing tube placed in your throat during surgery. If this causes discomfort, gargle with warm salt water. The discomfort should disappear within 24 hours.  If you had a scopolamine patch placed behind your ear for the management of post- operative nausea and/or vomiting:  1. The medication in the patch is effective for 72 hours, after which it should be removed.  Wrap patch in a tissue and discard in the trash. Wash hands thoroughly with soap and water. 2. You may remove the patch earlier than 72 hours if you experience unpleasant side effects which may include dry mouth, dizziness or visual disturbances. 3. Avoid touching the patch. Wash your hands with soap and water after contact with the patch.   DISCHARGE INSTRUCTIONS: Laparoscopy  The following instructions have been prepared to help you care for yourself upon your return home today.  Wound care:  Do not get the incision wet for the first 24 hours. The incision should be kept clean and dry.  The Band-Aids or dressings may be removed the day after surgery.  Should the incision become sore, red, and swollen after the first week, check with your doctor.  Personal hygiene:  Shower the day after  your procedure.  Activity and limitations:  Do NOT drive or operate any equipment today.  Do NOT lift anything more than 15 pounds for 2-3 weeks after surgery.  Do NOT rest in bed all day.  Walking is encouraged. Walk each day, starting slowly with 5-minute walks 3 or 4 times a day. Slowly increase the length of your walks.  Walk up and down stairs slowly.  Do NOT do strenuous activities, such as golfing, playing tennis, bowling, running, biking, weight lifting, gardening, mowing, or vacuuming for 2-4 weeks. Ask your doctor when it is okay to start.  Diet: Eat a light meal as desired this evening. You may resume your usual diet tomorrow.  Return to work: This is dependent on the type of work you do. For the most part you can return to a desk job within a week of surgery. If you are more active at work, please discuss this with your doctor.  What to expect after your surgery: You may have a slight burning sensation when you urinate on the first day. You may have a very small amount of blood in the urine. Expect to have a small amount of vaginal discharge/light bleeding for 1-2 weeks. It is not unusual to have abdominal soreness and bruising for up to 2 weeks. You may be tired and need more rest for about 1 week. You may experience shoulder pain for 24-72 hours. Lying flat in bed may relieve it.  Call your doctor for any of the following:  Develop a fever of 100.4 or greater  Inability to urinate 6 hours after discharge  from hospital  Severe pain not relieved by pain medications  Persistent of heavy bleeding at incision site  Redness or swelling around incision site after a week  Increasing nausea or vomiting  Patient Signature________________________________________ Nurse Signature_________________________________________    May have nest dose of advil/motrin after 6:30 today

## 2020-10-10 NOTE — Op Note (Signed)
Operative Note  Preoperative diagnosis: Abnormal uterine bleeding, uterine myomas   Postoperative diagnosis: Abnormal uterine bleeding, uterine myomas, intramural, subserosal, and intraligamentary  Procedure: Laparoscopy, Acessa radiofrequency ablation of uterine myomas (6-7 in total)  Anesthesia: Gen. endotracheal  Complications: None  Estimated blood loss: <10 cc  Specimens: None  Findings: On laparoscopy, upper abdomen, liver surface and diaphragm surfaces were normal. Gallbladder was normal.  The appendix was not visualized.  There was a fundal transmural myoma 5 x 5 cm and anterior myomas tube by 2 cm x 2 there was a right broad ligament myoma measuring 6 x 4 cm immediately adjacent to the right ovary and posterior transmural myoma 4 x 4 cm as well as 2 subserosal posterior myomas measuring 2 to 3 cm each. The fallopian tubes appeared normal proximally and distally. The pelvic peritoneum looked mostly normal.  Both ovaries appeared normal as well as the ovarian fossae.   Description of the procedure: The patient was placed in dorsal supine position and general endotracheal anesthesia was given. 2 g of cefazolin were given intravenously for prophylaxis. Patient was placed in lithotomy position. She was prepped and draped inside manner.  The bladder was straight catheterized . A ZUMI catheter was placed into the uterine cavity.  The uterus sounded to 11 cm.  The surgeon was regloved and a surgical field was created on the abdomen.  After preemptive anesthesia of all surgical sites with 0.25% bupivacaine with 1 200,000 epinephrine, a 5 mm intraumbilical skin incision was made and a Verress needle was inserted. Its correct location was confirmed. A pneumoperitoneum was created with carbon dioxide.  5 mm laparoscope with a 30 lens was inserted and video laparoscopy was started .  A suprapubic 10 mm incision was made at the midline for intraoperative ultrasound probe.  Above findings were  noted.  Using a Sessa radiofrequency ablation needle and starting with the large fundal transmural/subserosal 5 cm myoma all of the fibroids were treated under ultrasound and laparoscopic guidance.  Continuous temperature monitoring for all needles was reviewed at all times and averaged 100 C the return pad temperature was noted to be below 40 C at all times.  13 treatments for a total of 20 minutes and 45 seconds was given to the largest fundal myoma, 6 treatments were given for a total of 6 minutes and 30 seconds to the posterior 2 x 3 cm myoma and 2 treatments for a total of 1 minute and 30 seconds for the posterior 2.5 x 2 cm myoma and 2 treatments for a total of 1 minute to the posterior 1 x 2 cm myoma and 4 treatments to the right anterior 2 x 1.5 cm myoma for a total of 2 minutes and 1 treatment for a total of 2 minutes was given to the right broad ligament myoma. The pelvis was carefully inspected and no injury to surrounding organs and no bleeding was noted.  The corpus was copiously irrigated and aspirated.  The gas was allowed to escape.  The lower abdominal midline incision was closed with 2-0 Vicryl in subcuticular layer after achieving hemostasis on the parietal peritoneum.  The skin incisions were closed with 4-0 Monocryl in subcuticular stitches. The incisions were approximated with Dermabond.   The patient tolerated the procedure well and was transferred to recovery room in satisfactory condition.   Governor Specking, MD

## 2020-10-10 NOTE — Anesthesia Preprocedure Evaluation (Addendum)
Anesthesia Evaluation  Patient identified by MRN, date of birth, ID band Patient awake    Reviewed: Allergy & Precautions, NPO status , Patient's Chart, lab work & pertinent test results  History of Anesthesia Complications (+) PONV and history of anesthetic complications  Airway Mallampati: II  TM Distance: >3 FB     Dental  (+) Teeth Intact, Dental Advisory Given, Caps   Pulmonary    breath sounds clear to auscultation       Cardiovascular  Rhythm:Regular Rate:Normal     Neuro/Psych    GI/Hepatic   Endo/Other    Renal/GU      Musculoskeletal   Abdominal   Peds  Hematology  (+) Blood dyscrasia, anemia ,   Anesthesia Other Findings   Reproductive/Obstetrics                            Anesthesia Physical  Anesthesia Plan  ASA: 2  Anesthesia Plan: General   Post-op Pain Management:    Induction: Intravenous  PONV Risk Score and Plan: 3 and Ondansetron, Dexamethasone, Treatment may vary due to age or medical condition and Scopolamine patch - Pre-op  Airway Management Planned: LMA  Additional Equipment:   Intra-op Plan:   Post-operative Plan:   Informed Consent: I have reviewed the patients History and Physical, chart, labs and discussed the procedure including the risks, benefits and alternatives for the proposed anesthesia with the patient or authorized representative who has indicated his/her understanding and acceptance.     Dental advisory given  Plan Discussed with: CRNA and Anesthesiologist  Anesthesia Plan Comments:         Anesthesia Quick Evaluation

## 2020-10-10 NOTE — Anesthesia Postprocedure Evaluation (Signed)
Anesthesia Post Note  Patient: Mariah Nichols  Procedure(s) Performed: Laparoscopic Ablation of Uterine Fibroids with Acessa     Patient location during evaluation: PACU Anesthesia Type: General Level of consciousness: awake and alert Pain management: pain level controlled Vital Signs Assessment: post-procedure vital signs reviewed and stable Respiratory status: spontaneous breathing, nonlabored ventilation, respiratory function stable and patient connected to nasal cannula oxygen Cardiovascular status: blood pressure returned to baseline and stable Postop Assessment: no apparent nausea or vomiting Anesthetic complications: no   No notable events documented.  Last Vitals:  Vitals:   10/10/20 1615 10/10/20 1645  BP: 116/74 112/77  Pulse: 93 88  Resp: 15 20  Temp:  36.6 C  SpO2: 91% 96%    Last Pain:  Vitals:   10/10/20 1645  TempSrc:   PainSc: 5                  Arrington Bencomo

## 2020-10-10 NOTE — Anesthesia Procedure Notes (Signed)
Procedure Name: Intubation Date/Time: 10/10/2020 12:17 PM Performed by: Myna Bright, CRNA Pre-anesthesia Checklist: Patient identified, Emergency Drugs available, Suction available and Patient being monitored Patient Re-evaluated:Patient Re-evaluated prior to induction Oxygen Delivery Method: Circle system utilized Preoxygenation: Pre-oxygenation with 100% oxygen Induction Type: IV induction Ventilation: Mask ventilation without difficulty Laryngoscope Size: Mac and 3 Grade View: Grade I Tube type: Oral Tube size: 7.0 mm Number of attempts: 1 Airway Equipment and Method: Stylet Placement Confirmation: ETT inserted through vocal cords under direct vision, positive ETCO2 and breath sounds checked- equal and bilateral Secured at: 21 cm Tube secured with: Tape Dental Injury: Teeth and Oropharynx as per pre-operative assessment

## 2020-10-10 NOTE — Transfer of Care (Signed)
Immediate Anesthesia Transfer of Care Note  Patient: Mariah Nichols  Procedure(s) Performed: Laparoscopic Ablation of Uterine Fibroids with Acessa  Patient Location: PACU  Anesthesia Type:General  Level of Consciousness: awake, patient cooperative and responds to stimulation  Airway & Oxygen Therapy: Patient Spontanous Breathing and Patient connected to face mask oxygen  Post-op Assessment: Report given to RN and Post -op Vital signs reviewed and stable  Post vital signs: Reviewed and stable  Last Vitals:  Vitals Value Taken Time  BP 139/88 10/10/20 1530  Temp    Pulse 109 10/10/20 1531  Resp 13 10/10/20 1531  SpO2 93 % 10/10/20 1531  Vitals shown include unvalidated device data.  Last Pain:  Vitals:   10/10/20 1037  TempSrc: Oral  PainSc: 3       Patients Stated Pain Goal: 5 ([id number])  Complications: No notable events documented.

## 2020-10-10 NOTE — H&P (Signed)
Mariah Nichols is a 40 y.o. female , originally referred to me by Dr. Quincy Nichols for laparoscopic Acessa procedure.  She was diagnosed with fibroids because of abnormal uterine bleeding.  She was recently placed on Oriahnn and she has been having monthly periods but with heavy flow and prolonged duration.  Patient would like to preserve her uterus.  Pertinent Gynecological History: Menses: flow is excessive with use of 3 pads or tampons on heaviest days Bleeding: dysfunctional uterine bleeding Contraception: none DES exposure: denies Blood transfusions: none Sexually transmitted diseases: no past history  Last mammogram: normal Last pap: normal   Menstrual History: Menarche age: 64 No LMP recorded.    Past Medical History:  Diagnosis Date   Anemia    BORDERLINE   Basal cell carcinoma of left shoulder 05/2005   shoulder   Complication of anesthesia    likes scopolamine patch   COVID 10/2019   COVID 08/2020   rapid home test headche diarrhea head congestion x 3 days all symptoms reoslved   Hyperlipidemia    Leiomyoma 10/04/2020   PONV (postoperative nausea and vomiting)    Skin cancer 2021   left upper thigh                    Past Surgical History:  Procedure Laterality Date   BASAL CELL CARCINOMA EXCISION  2007   left shoulder   DILATATION & CURETTAGE/HYSTEROSCOPY WITH MYOSURE N/A 06/26/2015   Procedure: DILATATION & CURETTAGE/HYSTEROSCOPY WITH MYOSURE RESECTION OF SUBMUCOUS FIBROID ;  Surgeon: Mariah Cobbs, MD;  Location: [facility];  Service: Gynecology;  Laterality: N/A;  fibroid resection   skin cancer excision  2021   upper left thigh   WISDOM TOOTH EXTRACTION  age 80             Family History  Problem Relation Age of Onset   Multiple births Other        had twins   Hypertension Maternal Grandmother    Kidney Stones Maternal Grandfather    Anemia Mother    Uterine cancer Mother 62       uterus   Thyroid disease Paternal Grandmother    No  hereditary disease.  No cancer of breast, ovary, uterus. No cutaneous leiomyomatosis or renal cell carcinoma.  Social History   Socioeconomic History   Marital status: Single    Spouse name: Not on file   Number of children: 0   Years of education: Not on file   Highest education level: Not on file  Occupational History   Not on file  Tobacco Use   Smoking status: Never   Smokeless tobacco: Never  Vaping Use   Vaping Use: Never used  Substance and Sexual Activity   Alcohol use: Yes    Alcohol/week: 3.0 standard drinks    Types: 3 Glasses of wine per week    Comment: 1-3 GLASSES PER WEEK   Drug use: No   Sexual activity: Not Currently    Partners: Male  Other Topics Concern   Not on file  Social History Narrative   Not on file   Social Determinants of Health   Financial Resource Strain: Not on file  Food Insecurity: Not on file  Transportation Needs: Not on file  Physical Activity: Not on file  Stress: Not on file  Social Connections: Not on file  Intimate Partner Violence: Not on file    Allergies  Allergen Reactions   Azithromycin Diarrhea    DIARRHEA  No current facility-administered medications on file prior to encounter.   Current Outpatient Medications on File Prior to Encounter  Medication Sig Dispense Refill   co-enzyme Q-10 30 MG capsule Take 30 mg by mouth daily.     Elagolix-Estrad-Noreth & Elago (ORIAHNN) 300-1-0.5 & 300 MG CPPK Take by mouth in the morning and at bedtime.     Multiple Vitamin (MULTIVITAMIN ADULT PO) Take by mouth daily.     Omega-3 Fatty Acids (FISH OIL) 1000 MG CAPS Take by mouth in the morning and at bedtime.     Red Yeast Rice Extract (RED YEAST RICE PO) Take by mouth daily.     fexofenadine (ALLEGRA) 180 MG tablet Take 180 mg by mouth as needed for allergies or rhinitis.     fluticasone (FLONASE) 50 MCG/ACT nasal spray Place 1 spray into both nostrils as needed.       Review of Systems  Constitutional: Negative.   HENT:  Negative.   Eyes: Negative.   Respiratory: Negative.   Cardiovascular: Negative.   Gastrointestinal: Negative.   Genitourinary: Negative.   Musculoskeletal: Negative.   Skin: Negative.   Neurological: Negative.   Endo/Heme/Allergies: Negative.   Psychiatric/Behavioral: Negative.      Physical Exam  BP 127/80   Pulse 86   Temp 98.7 F (37.1 C) (Oral)   Resp 16   Ht '5\' 3"'$  (1.6 m)   Wt 87.5 kg   LMP 09/02/2020   SpO2 100%   BMI 34.19 kg/m  Constitutional: She is oriented to person, place, and time. She appears well-developed and well-nourished.  HENT:  Head: Normocephalic and atraumatic.  Nose: Nose normal.  Mouth/Throat: Oropharynx is clear and moist. No oropharyngeal exudate.  Eyes: Conjunctivae normal and EOM are normal. Pupils are equal, round, and reactive to light. No scleral icterus.  Neck: Normal range of motion. Neck supple. No tracheal deviation present. No thyromegaly present.  Cardiovascular: Normal rate.   Respiratory: Effort normal and breath sounds normal.  GI: Soft. Bowel sounds are normal. She exhibits no distension and no mass. There is no tenderness.  Lymphadenopathy:    She has no cervical adenopathy.  Neurological: She is alert and oriented to person, place, and time. She has normal reflexes.  Skin: Skin is warm.  Psychiatric: She has a normal mood and affect. Her behavior is normal. Judgment and thought content normal.     Assessment/Plan:  Intramural and subserosal uterine myomas, causing menorrhagia and pressure sensation. Preoperative for laparoscopic radiofrequency ablation of myomas Benefits and risks of the recommended procedure were discussed with the patient and her family member again.  Bowel prep instructions were given.  All of patient's questions were answered.  She verbalized understanding.

## 2020-10-18 ENCOUNTER — Telehealth: Payer: Self-pay

## 2020-10-18 NOTE — Telephone Encounter (Signed)
I recommend patient reschedule her annual exam with me for November, 2022.   I hope she is doing well following her fibroid surgery!

## 2020-10-18 NOTE — Telephone Encounter (Signed)
Patient is scheduled for AEX next week 10/23/20.  She called to see if she should postpone that appointment.  She had surgery last week 10/10/20 with Dr. Kerin Perna. She had Lap Ablation of Uterine Fibroids w/ Acessa.  Her concerns were with having a Pap or pelvic so soon after surgery.

## 2020-10-19 NOTE — Telephone Encounter (Signed)
Dr. Quincy Simmonds spoke with pt. Aware of recommendations. Closing encounter

## 2020-10-23 ENCOUNTER — Ambulatory Visit: Payer: BC Managed Care – PPO | Admitting: Obstetrics and Gynecology

## 2020-12-13 ENCOUNTER — Other Ambulatory Visit: Payer: Self-pay | Admitting: Obstetrics and Gynecology

## 2020-12-13 NOTE — Telephone Encounter (Signed)
Please contact patient to move up her annual exam appointment.  Her last annual exam was August, 2021.   I will give 3 months of her birth control.

## 2020-12-13 NOTE — Telephone Encounter (Signed)
Message sent to appointments to reach out.

## 2020-12-13 NOTE — Telephone Encounter (Signed)
Annual exam scheduled in 08/2021

## 2021-08-19 NOTE — Progress Notes (Signed)
41 y.o. G0P0 Single Caucasian female here for annual exam.    Status post Acessa procedure with Dr. Kerin Perna for uterine fibroids. Has less bleeding and pain with her cycles.  Had excision of a mole from her right breast.  Benign.  Declines contraception and STD testing.   PCP: Kathyrn Lass, MD  Patient's last menstrual period was 08/09/2021 (exact date).     Period Cycle (Days): 30 Period Duration (Days): 5-6 Period Pattern: Regular Menstrual Flow: Moderate Menstrual Control: Maxi pad, Tampon Menstrual Control Change Freq (Hours): changes maxi pad every 2-3 hours on heaviest day Dysmenorrhea: (!) Mild Dysmenorrhea Symptoms: Cramping     Sexually active: No.  The current method of family planning is abstinence    Exercising: No.   sometimes Smoker:  no  Health Maintenance: Pap:  06/12/17 Neg:Neg HR HPV, 05-19-14 Neg:Neg HR HPV History of abnormal Pap:  no MMG:  none.  She will schedule. Colonoscopy:  n/a BMD:   n/a  Result  n/a TDaP:  10-19-19 Gardasil:   no HIV: 09-16-18 NR Hep C: 09-16-18 Neg Screening Labs:  PCP   reports that she has never smoked. She has never used smokeless tobacco. She reports current alcohol use of about 3.0 standard drinks of alcohol per week. She reports that she does not use drugs.  Past Medical History:  Diagnosis Date   Anemia    BORDERLINE   Basal cell carcinoma of left shoulder 05/2005   shoulder   Complication of anesthesia    likes scopolamine patch   COVID 10/2019   COVID 08/2020   rapid home test headche diarrhea head congestion x 3 days all symptoms reoslved   Hyperlipidemia    Leiomyoma 10/04/2020   PONV (postoperative nausea and vomiting)    Skin cancer 2021   left upper thigh    Past Surgical History:  Procedure Laterality Date   ablation of uterine fibroids  10/05/2020   Dr Carmela Rima   BASAL CELL CARCINOMA EXCISION  2007   left shoulder   DILATATION & CURETTAGE/HYSTEROSCOPY WITH MYOSURE N/A 06/26/2015   Procedure:  DILATATION & CURETTAGE/HYSTEROSCOPY WITH MYOSURE RESECTION OF SUBMUCOUS FIBROID ;  Surgeon: Nunzio Cobbs, MD;  Location: [facility];  Service: Gynecology;  Laterality: N/A;  fibroid resection   skin cancer excision  2021   upper left thigh   WISDOM TOOTH EXTRACTION  age 46    Current Outpatient Medications  Medication Sig Dispense Refill   co-enzyme Q-10 30 MG capsule Take 30 mg by mouth daily.     fexofenadine (ALLEGRA) 180 MG tablet Take 180 mg by mouth as needed for allergies or rhinitis.     Multiple Vitamin (MULTIVITAMIN ADULT PO) Take by mouth daily.     Omega-3 Fatty Acids (FISH OIL) 1000 MG CAPS Take by mouth in the morning and at bedtime.     Red Yeast Rice Extract (RED YEAST RICE PO) Take by mouth daily.     Turmeric 500 MG CAPS See admin instructions.     No current facility-administered medications for this visit.    Family History  Problem Relation Age of Onset   Multiple births Other        had twins   Hypertension Maternal Grandmother    Kidney Stones Maternal Grandfather    Anemia Mother    Uterine cancer Mother 50       uterus   Thyroid disease Paternal Grandmother     Review of Systems  All other systems reviewed and  are negative.   Exam:   BP 118/72   Pulse 94   Ht 5' 2.5" (1.588 m)   Wt 202 lb (91.6 kg)   LMP 08/09/2021 (Exact Date)   SpO2 96%   BMI 36.36 kg/m     General appearance: alert, cooperative and appears stated age Head: normocephalic, without obvious abnormality, atraumatic Neck: no adenopathy, supple, symmetrical, trachea midline and thyroid normal to inspection and palpation Lungs: clear to auscultation bilaterally Breasts: left - normal appearance, no masses or tenderness, No nipple retraction or dimpling, No nipple discharge or bleeding, No axillary adenopathy Right - circular scar, no masses or tenderness, No nipple retraction or dimpling, No nipple discharge or bleeding, No axillary adenopathy Heart: regular rate and  rhythm Abdomen: soft, non-tender; no masses, no organomegaly Extremities: extremities normal, atraumatic, no cyanosis or edema Skin: skin color, texture, turgor normal. No rashes or lesions Lymph nodes: cervical, supraclavicular, and axillary nodes normal. Neurologic: grossly normal  Pelvic: External genitalia:  no lesions              No abnormal inguinal nodes palpated.              Urethra:  normal appearing urethra with no masses, tenderness or lesions              Bartholins and Skenes: normal                 Vagina: normal appearing vagina with normal color and discharge, no lesions              Cervix: no lesions              Pap taken: no Bimanual Exam:  Uterus:  8 week size.              Adnexa: no mass, fullness, tenderness              Rectal exam: yes.  Confirms.              Anus:  normal sphincter tone, no lesions  Chaperone was present for exam:  Estill Bamberg, CMA  Assessment:   Well woman visit with gynecologic exam. Hx uterine fibroids.  Status post hysteroscopic resection of myoma.  Status post Acessa.  FH uterine cancer.   Plan: Mammogram screening discussed. Self breast awareness reviewed. Pap and HR HPV 2024. Guidelines for Calcium, Vitamin D, regular exercise program including cardiovascular and weight bearing exercise.   Follow up annually and prn.   After visit summary provided.

## 2021-08-22 ENCOUNTER — Ambulatory Visit (INDEPENDENT_AMBULATORY_CARE_PROVIDER_SITE_OTHER): Payer: BC Managed Care – PPO | Admitting: Obstetrics and Gynecology

## 2021-08-22 ENCOUNTER — Encounter: Payer: Self-pay | Admitting: Obstetrics and Gynecology

## 2021-08-22 ENCOUNTER — Other Ambulatory Visit: Payer: Self-pay | Admitting: Obstetrics and Gynecology

## 2021-08-22 VITALS — BP 118/72 | HR 94 | Ht 62.5 in | Wt 202.0 lb

## 2021-08-22 DIAGNOSIS — Z1231 Encounter for screening mammogram for malignant neoplasm of breast: Secondary | ICD-10-CM

## 2021-08-22 DIAGNOSIS — Z01419 Encounter for gynecological examination (general) (routine) without abnormal findings: Secondary | ICD-10-CM | POA: Diagnosis not present

## 2021-08-22 NOTE — Patient Instructions (Signed)
EXERCISE AND DIET:  We recommended that you start or continue a regular exercise program for good health. Regular exercise means any activity that makes your heart beat faster and makes you sweat.  We recommend exercising at least 30 minutes per day at least 3 days a week, preferably 4 or 5.  We also recommend a diet low in fat and sugar.  Inactivity, poor dietary choices and obesity can cause diabetes, heart attack, stroke, and kidney damage, among others.    ALCOHOL AND SMOKING:  Women should limit their alcohol intake to no more than 7 drinks/beers/glasses of wine (combined, not each!) per week. Moderation of alcohol intake to this level decreases your risk of breast cancer and liver damage. And of course, no recreational drugs are part of a healthy lifestyle.  And absolutely no smoking or even second hand smoke. Most people know smoking can cause heart and lung diseases, but did you know it also contributes to weakening of your bones? Aging of your skin?  Yellowing of your teeth and nails?  CALCIUM AND VITAMIN D:  Adequate intake of calcium and Vitamin D are recommended.  The recommendations for exact amounts of these supplements seem to change often, but generally speaking 600 mg of calcium (either carbonate or citrate) and 800 units of Vitamin D per day seems prudent. Certain women may benefit from higher intake of Vitamin D.  If you are among these women, your doctor will have told you during your visit.    PAP SMEARS:  Pap smears, to check for cervical cancer or precancers,  have traditionally been done yearly, although recent scientific advances have shown that most women can have pap smears less often.  However, every woman still should have a physical exam from her gynecologist every year. It will include a breast check, inspection of the vulva and vagina to check for abnormal growths or skin changes, a visual exam of the cervix, and then an exam to evaluate the size and shape of the uterus and  ovaries.  And after 40 years of age, a rectal exam is indicated to check for rectal cancers. We will also provide age appropriate advice regarding health maintenance, like when you should have certain vaccines, screening for sexually transmitted diseases, bone density testing, colonoscopy, mammograms, etc.   MAMMOGRAMS:  All women over 40 years old should have a yearly mammogram. Many facilities now offer a "3D" mammogram, which may cost around $50 extra out of pocket. If possible,  we recommend you accept the option to have the 3D mammogram performed.  It both reduces the number of women who will be called back for extra views which then turn out to be normal, and it is better than the routine mammogram at detecting truly abnormal areas.    COLONOSCOPY:  Colonoscopy to screen for colon cancer is recommended for all women at age 50.  We know, you hate the idea of the prep.  We agree, BUT, having colon cancer and not knowing it is worse!!  Colon cancer so often starts as a polyp that can be seen and removed at colonscopy, which can quite literally save your life!  And if your first colonoscopy is normal and you have no family history of colon cancer, most women don't have to have it again for 10 years.  Once every ten years, you can do something that may end up saving your life, right?  We will be happy to help you get it scheduled when you are ready.    Be sure to check your insurance coverage so you understand how much it will cost.  It may be covered as a preventative service at no cost, but you should check your particular policy.    Calcium Content in Foods Calcium is the most abundant mineral in the body. Most of the body's calcium supply is stored in bones and teeth. Calcium helps many parts of the body function normally, including: Blood and blood vessels. Nerves. Hormones. Muscles. Bones and teeth. When your calcium stores are low, you may be at risk for low bone mass, bone loss, and broken bones  (fractures). When you get enough calcium, it helps to support strong bones and teeth throughout your life. Calcium is especially important for: Children during growth spurts. Girls during adolescence. Women who are pregnant or breastfeeding. Women after their menstrual cycle stops (postmenopause). Women whose menstrual cycle has stopped due to anorexia nervosa or regular intense exercise. People who cannot eat or digest dairy products. Vegans. Recommended daily amounts of calcium: Women (ages 19 to 50): 1,000 mg per day. Women (ages 51 and older): 1,200 mg per day. Men (ages 19 to 70): 1,000 mg per day. Men (ages 71 and older): 1,200 mg per day. Women (ages 9 to 18): 1,300 mg per day. Men (ages 9 to 18): 1,300 mg per day. General information Eat foods that are high in calcium. Try to get most of your calcium from food. Some people may benefit from taking calcium supplements. Check with your health care provider or diet and nutrition specialist (dietitian) before starting any calcium supplements. Calcium supplements may interact with certain medicines. Too much calcium may cause other health problems, such as constipation and kidney stones. For the body to absorb calcium, it needs vitamin D. Sources of vitamin D include: Skin exposure to direct sunlight. Foods, such as egg yolks, liver, mushrooms, saltwater fish, and fortified milk. Vitamin D supplements. Check with your health care provider or dietitian before starting any vitamin D supplements. What foods are high in calcium?  Foods that are high in calcium contain more than 100 milligrams per serving. Fruits Fortified orange juice or other fruit juice, 300 mg per 8 oz serving. Vegetables Collard greens, 360 mg per 8 oz serving. Kale, 100 mg per 8 oz serving. Bok choy, 160 mg per 8 oz serving. Grains Fortified ready-to-eat cereals, 100 to 1,000 mg per 8 oz serving. Fortified frozen waffles, 200 mg in 2 waffles. Oatmeal, 140 mg in  1 cup. Meats and other proteins Sardines, canned with bones, 325 mg per 3 oz serving. Salmon, canned with bones, 180 mg per 3 oz serving. Canned shrimp, 125 mg per 3 oz serving. Baked beans, 160 mg per 4 oz serving. Tofu, firm, made with calcium sulfate, 253 mg per 4 oz serving. Dairy Yogurt, plain, low-fat, 310 mg per 6 oz serving. Nonfat milk, 300 mg per 8 oz serving. American cheese, 195 mg per 1 oz serving. Cheddar cheese, 205 mg per 1 oz serving. Cottage cheese 2%, 105 mg per 4 oz serving. Fortified soy, rice, or almond milk, 300 mg per 8 oz serving. Mozzarella, part skim, 210 mg per 1 oz serving. The items listed above may not be a complete list of foods high in calcium. Actual amounts of calcium may be different depending on processing. Contact a dietitian for more information. What foods are lower in calcium? Foods that are lower in calcium contain 50 mg or less per serving. Fruits Apple, about 6 mg. Banana, about 12 mg.   Vegetables Lettuce, 19 mg per 2 oz serving. Tomato, about 11 mg. Grains Rice, 4 mg per 6 oz serving. Boiled potatoes, 14 mg per 8 oz serving. White bread, 6 mg per slice. Meats and other proteins Egg, 27 mg per 2 oz serving. Red meat, 7 mg per 4 oz serving. Chicken, 17 mg per 4 oz serving. Fish, cod, or trout, 20 mg per 4 oz serving. Dairy Cream cheese, regular, 14 mg per 1 Tbsp serving. Brie cheese, 50 mg per 1 oz serving. Parmesan cheese, 70 mg per 1 Tbsp serving. The items listed above may not be a complete list of foods lower in calcium. Actual amounts of calcium may be different depending on processing. Contact a dietitian for more information. Summary Calcium is an important mineral in the body because it affects many functions. Getting enough calcium helps support strong bones and teeth throughout your life. Try to get most of your calcium from food. Calcium supplements may interact with certain medicines. Check with your health care provider  or dietitian before starting any calcium supplements. This information is not intended to replace advice given to you by your health care provider. Make sure you discuss any questions you have with your health care provider. Document Revised: 06/29/2019 Document Reviewed: 06/29/2019 Elsevier Patient Education  2023 Elsevier Inc.  

## 2021-09-13 ENCOUNTER — Ambulatory Visit
Admission: RE | Admit: 2021-09-13 | Discharge: 2021-09-13 | Disposition: A | Discharge status: Home / Self Care | Payer: BC Managed Care – PPO | Source: Ambulatory Visit | Attending: Obstetrics and Gynecology | Admitting: Obstetrics and Gynecology

## 2021-09-13 DIAGNOSIS — Z1231 Encounter for screening mammogram for malignant neoplasm of breast: Secondary | ICD-10-CM

## 2022-09-30 NOTE — Progress Notes (Signed)
42 y.o. G0P0 Single Caucasian female here for annual exam.    No menstrual concerns.    Declines STD testing.   New dx of elevated cholesterol and will be starting medication.   PCP:   Sigmund Hazel, MD  Patient's last menstrual period was 09/26/2022.     Period Cycle (Days): 28 Period Duration (Days): 4-5 Period Pattern: Regular Menstrual Flow: Light, Heavy Menstrual Control: Maxi pad, Tampon Dysmenorrhea: (!) Moderate     Sexually active: No.  The current method of family planning is abstinence.    Exercising: No.   Smoker:  no  Health Maintenance: Pap:  06/12/17 neg: HR HPV neg History of abnormal Pap:  no MMG:  09/13/21 Breast Density Cat C, BI-RADS CAT 1 neg Colonoscopy:  n/a BMD:   n/a  Result  n/a TDaP:  10/19/19 Gardasil:   no HIV: 10/19/19 NR Hep C: 10/19/19 NR Screening Labs:  PCP   reports that she has never smoked. She has never used smokeless tobacco. She reports current alcohol use of about 3.0 standard drinks of alcohol per week. She reports that she does not use drugs.  Past Medical History:  Diagnosis Date   Anemia    BORDERLINE   Basal cell carcinoma of left shoulder 05/2005   shoulder   Complication of anesthesia    likes scopolamine patch   COVID 10/2019   COVID 08/2020   rapid home test headche diarrhea head congestion x 3 days all symptoms reoslved   Hyperlipidemia    Leiomyoma 10/04/2020   PONV (postoperative nausea and vomiting)    Skin cancer 2021   left upper thigh    Past Surgical History:  Procedure Laterality Date   ablation of uterine fibroids  10/05/2020   Dr Jamse Arn   BASAL CELL CARCINOMA EXCISION  2007   left shoulder   DILATATION & CURETTAGE/HYSTEROSCOPY WITH MYOSURE N/A 06/26/2015   Procedure: DILATATION & CURETTAGE/HYSTEROSCOPY WITH MYOSURE RESECTION OF SUBMUCOUS FIBROID ;  Surgeon: Patton Salles, MD;  Location: [facility];  Service: Gynecology;  Laterality: N/A;  fibroid resection   skin cancer excision  2021   upper  left thigh   WISDOM TOOTH EXTRACTION  age 89    Current Outpatient Medications  Medication Sig Dispense Refill   co-enzyme Q-10 30 MG capsule Take 30 mg by mouth daily.     fexofenadine (ALLEGRA) 180 MG tablet Take 180 mg by mouth as needed for allergies or rhinitis.     Multiple Vitamin (MULTIVITAMIN ADULT PO) Take by mouth daily.     Omega-3 Fatty Acids (FISH OIL) 1000 MG CAPS Take by mouth in the morning and at bedtime.     Red Yeast Rice Extract (RED YEAST RICE PO) Take by mouth daily.     No current facility-administered medications for this visit.    Family History  Problem Relation Age of Onset   Multiple births Other        had twins   Hypertension Maternal Grandmother    Kidney Stones Maternal Grandfather    Anemia Mother    Uterine cancer Mother 24       uterus   Thyroid disease Paternal Grandmother     Review of Systems  All other systems reviewed and are negative.   Exam:   BP 120/84 (BP Location: Left Arm, Patient Position: Sitting, Cuff Size: Normal)   Pulse 88   Ht 5' 2.5" (1.588 m)   Wt 205 lb (93 kg)   LMP 09/26/2022  SpO2 97%   BMI 36.90 kg/m     General appearance: alert, cooperative and appears stated age Head: normocephalic, without obvious abnormality, atraumatic Neck: no adenopathy, supple, symmetrical, trachea midline and thyroid normal to inspection and palpation Lungs: clear to auscultation bilaterally Breasts: normal appearance, no masses or tenderness, No nipple retraction or dimpling, No nipple discharge or bleeding, No axillary adenopathy Heart: regular rate and rhythm Abdomen: soft, non-tender; no masses, no organomegaly Extremities: extremities normal, atraumatic, no cyanosis or edema Skin: skin color, texture, turgor normal. No rashes or lesions Lymph nodes: cervical, supraclavicular, and axillary nodes normal. Neurologic: grossly normal  Pelvic: External genitalia:  no lesions              No abnormal inguinal nodes palpated.               Urethra:  normal appearing urethra with no masses, tenderness or lesions              Bartholins and Skenes: normal                 Vagina: normal appearing vagina with normal color and discharge, no lesions              Cervix: no lesions              Pap taken: yes Bimanual Exam:  Uterus:  normal size, contour, position, consistency, mobility, non-tender              Adnexa: no mass, fullness, tenderness              Rectal exam: yes.  Confirms.              Anus:  normal sphincter tone, no lesions  Chaperone was present for exam:  Warren Lacy, CMA  Assessment:   Well woman visit with gynecologic exam. Hx uterine fibroids.  Status post hysteroscopic resection of myoma.  Status post Acessa.  FH uterine cancer.   Plan: Mammogram screening discussed.  She will schedule.  Self breast awareness reviewed. Pap and HR HPV collected. Guidelines for Calcium, Vitamin D, regular exercise program including cardiovascular and weight bearing exercise.   Follow up annually and prn.

## 2022-10-06 ENCOUNTER — Other Ambulatory Visit (HOSPITAL_COMMUNITY)
Admission: RE | Admit: 2022-10-06 | Discharge: 2022-10-06 | Disposition: A | Discharge status: Home / Self Care | Payer: BC Managed Care – PPO | Source: Ambulatory Visit | Attending: Obstetrics and Gynecology | Admitting: Obstetrics and Gynecology

## 2022-10-06 ENCOUNTER — Encounter: Payer: Self-pay | Admitting: Obstetrics and Gynecology

## 2022-10-06 ENCOUNTER — Ambulatory Visit (INDEPENDENT_AMBULATORY_CARE_PROVIDER_SITE_OTHER): Payer: BC Managed Care – PPO | Admitting: Obstetrics and Gynecology

## 2022-10-06 ENCOUNTER — Other Ambulatory Visit: Payer: Self-pay | Admitting: Family Medicine

## 2022-10-06 VITALS — BP 120/84 | HR 88 | Ht 62.5 in | Wt 205.0 lb

## 2022-10-06 DIAGNOSIS — Z124 Encounter for screening for malignant neoplasm of cervix: Secondary | ICD-10-CM | POA: Diagnosis not present

## 2022-10-06 DIAGNOSIS — Z1231 Encounter for screening mammogram for malignant neoplasm of breast: Secondary | ICD-10-CM

## 2022-10-06 DIAGNOSIS — Z01419 Encounter for gynecological examination (general) (routine) without abnormal findings: Secondary | ICD-10-CM

## 2022-10-06 NOTE — Patient Instructions (Signed)

## 2022-10-08 LAB — CYTOLOGY - PAP
Adequacy: ABSENT
Comment: NEGATIVE
Diagnosis: NEGATIVE
High risk HPV: NEGATIVE

## 2022-10-09 ENCOUNTER — Ambulatory Visit
Admission: RE | Admit: 2022-10-09 | Discharge: 2022-10-09 | Disposition: A | Discharge status: Home / Self Care | Payer: BC Managed Care – PPO | Source: Ambulatory Visit | Attending: Family Medicine | Admitting: Family Medicine

## 2022-10-09 DIAGNOSIS — Z1231 Encounter for screening mammogram for malignant neoplasm of breast: Secondary | ICD-10-CM

## 2023-09-21 ENCOUNTER — Other Ambulatory Visit: Payer: Self-pay | Admitting: Obstetrics and Gynecology

## 2023-09-21 DIAGNOSIS — Z1231 Encounter for screening mammogram for malignant neoplasm of breast: Secondary | ICD-10-CM

## 2023-10-08 ENCOUNTER — Ambulatory Visit: Admitting: Obstetrics and Gynecology

## 2023-10-08 ENCOUNTER — Ambulatory Visit: Payer: BC Managed Care – PPO | Admitting: Obstetrics and Gynecology

## 2023-10-12 ENCOUNTER — Ambulatory Visit
Admission: RE | Admit: 2023-10-12 | Discharge: 2023-10-12 | Disposition: A | Discharge status: Home / Self Care | Payer: Self-pay | Source: Ambulatory Visit | Attending: Obstetrics and Gynecology | Admitting: Obstetrics and Gynecology

## 2023-10-12 DIAGNOSIS — Z1231 Encounter for screening mammogram for malignant neoplasm of breast: Secondary | ICD-10-CM

## 2023-10-15 ENCOUNTER — Ambulatory Visit (INDEPENDENT_AMBULATORY_CARE_PROVIDER_SITE_OTHER): Admitting: Obstetrics and Gynecology

## 2023-10-15 ENCOUNTER — Ambulatory Visit: Payer: Self-pay | Admitting: Obstetrics and Gynecology

## 2023-10-15 ENCOUNTER — Encounter: Payer: Self-pay | Admitting: Obstetrics and Gynecology

## 2023-10-15 VITALS — BP 118/80 | HR 91 | Temp 98.4°F | Ht 62.25 in | Wt 196.0 lb

## 2023-10-15 DIAGNOSIS — Z1331 Encounter for screening for depression: Secondary | ICD-10-CM

## 2023-10-15 DIAGNOSIS — Z01419 Encounter for gynecological examination (general) (routine) without abnormal findings: Secondary | ICD-10-CM | POA: Insufficient documentation

## 2023-10-15 NOTE — Progress Notes (Signed)
 43 y.o. G0P0 female with uterine fibroids (s/p lap fibroid ablation in 2022 with Dr. Yalcinkaya) here for annual exam. Single. HS social studies teacher.  Patient's last menstrual period was 09/23/2023 (exact date). Period Duration (Days): 7 Period Pattern: Regular Menstrual Flow:  (light then 3 days of heavy then light again) Menstrual Control: Maxi pad, Tampon Dysmenorrhea: (!) Moderate Dysmenorrhea Symptoms: Cramping  She reports doing well. No concerns. Traveled to [place]  and visited the state parks with her dad this year.  Abnormal bleeding: none Pelvic discharge or pain: none Breast mass, nipple discharge or skin changes : none  Sexually active: no Birth control: abstinent Last PAP:     Component Value Date/Time   DIAGPAP  10/06/2022 9177    - Negative for intraepithelial lesion or malignancy (NILM)   DIAGPAP  06/12/2017 0000    NEGATIVE FOR INTRAEPITHELIAL LESIONS OR MALIGNANCY.   HPVHIGH Negative 10/06/2022 0822   ADEQPAP  10/06/2022 0822    Satisfactory for evaluation; transformation zone component ABSENT.   ADEQPAP  06/12/2017 0000    Satisfactory for evaluation  endocervical/transformation zone component PRESENT.   Last mammogram: 10/12/23 BI-RADS 1, density C Last colonoscopy: none  Exercising: Yes. Weights 3-4 days a week & walking  Smoker: no  Garment/textile technologist Visit from 10/15/20[facility]  PHQ-2 Total Score 0      GYN HISTORY: Fibroids  OB History  Gravida Para Term Preterm AB Living  0 0      SAB IAB Ectopic Multiple Live Births         Past Medical History:  Diagnosis Date   Anemia    BORDERLINE   Basal cell carcinoma of left shoulder 05/2005   shoulder   Complication of anesthesia    likes scopolamine  patch   COVID 10/2019   COVID 08/2020   rapid home test headche diarrhea head congestion x 3 days all symptoms reoslved   Hyperlipidemia    Leiomyoma 10/04/2020   PONV (postoperative nausea and  vomiting)    Skin cancer 2021   left upper thigh   Past Surgical History:  Procedure Laterality Date   ablation of uterine fibroids  10/05/2020   Dr Levell   BASAL CELL CARCINOMA EXCISION  2007   left shoulder   DILATATION & CURETTAGE/HYSTEROSCOPY WITH MYOSURE N/A 06/26/2015   Procedure: DILATATION & CURETTAGE/HYSTEROSCOPY WITH MYOSURE RESECTION OF SUBMUCOUS FIBROID ;  Surgeon: Bobie FORBES Cathlyn JAYSON Nikki, MD;  Location: [facility];  Service: Gynecology;  Laterality: N/A;  fibroid resection   skin cancer excision  2021   upper left thigh   WISDOM TOOTH EXTRACTION  age 7   Current Outpatient Medications on File Prior to Visit  Medication Sig Dispense Refill   co-enzyme Q-10 30 MG capsule Take 30 mg by mouth daily.     fexofenadine (ALLEGRA) 180 MG tablet Take 180 mg by mouth as needed for allergies or rhinitis.     Multiple Vitamin (MULTIVITAMIN ADULT PO) Take by mouth daily.     Multiple Vitamins-Minerals (HAIR SKIN & NAILS PO) Take by mouth.     Omega-3 Fatty Acids (FISH OIL) 1000 MG CAPS Take by mouth in the morning and at bedtime.     rosuvastatin (CRESTOR) 10 MG tablet Take 10 mg by mouth daily.     VITAMIN D      No current facility-administered medications on file prior to visit.   Social History   Socioeconomic History   Marital status: Single  Spouse name: Not on file   Number of children: 0   Years of education: Not on file   Highest education level: Not on file  Occupational History   Not on file  Tobacco Use   Smoking status: Never   Smokeless tobacco: Never  Vaping Use   Vaping status: Never Used  Substance and Sexual Activity   Alcohol use: Not Currently    Comment: once a month   Drug use: No   Sexual activity: Not Currently    Partners: Male    Birth control/protection: Abstinence  Other Topics Concern   Not on file  Social History Narrative   Not on file   Social Drivers of Health   Financial Resource Strain: Not on file  Food Insecurity: Not  on file  Transportation Needs: Not on file  Physical Activity: Not on file  Stress: Not on file  Social Connections: Not on file  Intimate Partner Violence: Not on file   Family History  Problem Relation Age of Onset   Anemia Mother    Uterine cancer Mother 72       uterus   Hypertension Maternal Grandmother    Kidney Stones Maternal Grandfather    Thyroid disease Paternal Grandmother    Multiple births Other        had twins   BRCA 1/2 Neg Hx    Breast cancer Neg Hx    Allergies  Allergen Reactions   Azithromycin Diarrhea    DIARRHEA     PE Today's Vitals   10/15/23 0949  BP: 118/80  Pulse: 91  Temp: 98.4 F (36.9 C)  TempSrc: Oral  SpO2: 99%  Weight: 196 lb (88.9 kg)  Height: 5' 2.25 (1.581 m)   Body mass index is 35.56 kg/m.  Physical Exam Vitals reviewed. Exam conducted with a chaperone present.  Constitutional:      General: She is not in acute distress.    Appearance: Normal appearance.  HENT:     Head: Normocephalic and atraumatic.     Nose: Nose normal.  Eyes:     Extraocular Movements: Extraocular movements intact.     Conjunctiva/sclera: Conjunctivae normal.  Neck:     Thyroid: No thyroid mass, thyromegaly or thyroid tenderness.  Pulmonary:     Effort: Pulmonary effort is normal.  Chest:     Chest wall: No mass or tenderness.  Breasts:    Right: Normal. No swelling, mass, nipple discharge, skin change or tenderness.     Left: Normal. No swelling, mass, nipple discharge, skin change or tenderness.  Abdominal:     General: There is no distension.     Palpations: Abdomen is soft.     Tenderness: There is no abdominal tenderness.  Genitourinary:    General: Normal vulva.     Exam position: Lithotomy position.     Urethra: No prolapse.     Vagina: Normal. No vaginal discharge or bleeding.     Cervix: Normal. No lesion.     Uterus: Normal. Not enlarged and not tender.      Adnexa: Right adnexa normal and left adnexa normal.   Musculoskeletal:        General: Normal range of motion.     Cervical back: Normal range of motion.  Lymphadenopathy:     Upper Body:     Right upper body: No axillary adenopathy.     Left upper body: No axillary adenopathy.     Lower Body: No right inguinal adenopathy. No left inguinal  adenopathy.  Skin:    General: Skin is warm and dry.  Neurological:     General: No focal deficit present.     Mental Status: She is alert.  Psychiatric:        Mood and Affect: Mood normal.        Behavior: Behavior normal.      Assessment and Plan:        Well woman exam with routine gynecological exam Assessment & Plan: Cervical cancer screening performed according to ASCCP guidelines. Encouraged annual mammogram screening Labs and immunizations with her primary Encouraged safe sexual practices as indicated Encouraged healthy lifestyle practices with diet and exercise For patients under 50yo, I recommend 1000mg  calcium daily and 600IU of vitamin D daily.    Negative depression screening   Vera LULLA Pa, MD

## 2023-10-15 NOTE — Assessment & Plan Note (Signed)
 Cervical cancer screening performed according to ASCCP guidelines. Encouraged annual mammogram screening Labs and immunizations with her primary Encouraged safe sexual practices as indicated Encouraged healthy lifestyle practices with diet and exercise For patients under 43yo, I recommend 1000mg  calcium daily and 600IU of vitamin D daily.

## 2023-10-15 NOTE — Patient Instructions (Signed)

## 2024-10-17 ENCOUNTER — Ambulatory Visit: Admitting: Obstetrics and Gynecology
